# Patient Record
Sex: Female | Born: 1960 | Race: White | Hispanic: No | Marital: Married | Smoking: Never smoker
Health system: Southern US, Community
[De-identification: ages and names within clinical notes are randomized; demographics above are authoritative.]

## PROBLEM LIST (undated history)

## (undated) DIAGNOSIS — G43909 Migraine, unspecified, not intractable, without status migrainosus: Secondary | ICD-10-CM

## (undated) DIAGNOSIS — T8859XA Other complications of anesthesia, initial encounter: Secondary | ICD-10-CM

## (undated) DIAGNOSIS — R112 Nausea with vomiting, unspecified: Secondary | ICD-10-CM

## (undated) DIAGNOSIS — T4145XA Adverse effect of unspecified anesthetic, initial encounter: Secondary | ICD-10-CM

## (undated) DIAGNOSIS — Z9889 Other specified postprocedural states: Secondary | ICD-10-CM

## (undated) HISTORY — PX: FOOT SURGERY: SHX648

## (undated) HISTORY — PX: BILATERAL CARPAL TUNNEL RELEASE: SHX6508

## (undated) HISTORY — PX: ACCESSORY NAVICULAR EXCISION: SHX1121

## (undated) HISTORY — PX: JOINT REPLACEMENT: SHX530

## (undated) HISTORY — PX: HIP ARTHROPLASTY: SHX981

## (undated) HISTORY — PX: DILATION AND CURETTAGE OF UTERUS: SHX78

## (undated) HISTORY — PX: TUBAL LIGATION: SHX77

---
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
## 1993-02-18 DIAGNOSIS — G43909 Migraine, unspecified, not intractable, without status migrainosus: Secondary | ICD-10-CM | POA: Insufficient documentation

## 2005-10-01 ENCOUNTER — Ambulatory Visit: Payer: Self-pay | Admitting: Internal Medicine

## 2005-10-09 ENCOUNTER — Ambulatory Visit: Payer: Self-pay | Admitting: Internal Medicine

## 2005-10-18 ENCOUNTER — Ambulatory Visit: Payer: Self-pay | Admitting: Family Medicine

## 2005-11-29 ENCOUNTER — Ambulatory Visit: Payer: Self-pay | Admitting: Internal Medicine

## 2008-09-12 ENCOUNTER — Ambulatory Visit (HOSPITAL_COMMUNITY): Admission: RE | Admit: 2008-09-12 | Discharge: 2008-09-12 | Payer: Self-pay | Admitting: Orthopedic Surgery

## 2010-05-27 LAB — COMPREHENSIVE METABOLIC PANEL
ALT: 23 U/L (ref 0–35)
AST: 21 U/L (ref 0–37)
CO2: 24 mEq/L (ref 19–32)
Chloride: 106 mEq/L (ref 96–112)
Creatinine, Ser: 0.72 mg/dL (ref 0.4–1.2)
GFR calc Af Amer: >60 mL/min (ref >60)
GFR calc non Af Amer: >60 mL/min (ref >60)
Sodium: 139 mEq/L (ref 135–145)
Total Bilirubin: 0.8 mg/dL (ref 0.3–1.2)

## 2010-05-27 LAB — URINALYSIS, ROUTINE W REFLEX MICROSCOPIC
Hgb urine dipstick: NEGATIVE
Specific Gravity, Urine: 1.026 (ref 1.005–1.030)
Urobilinogen, UA: 0.2 mg/dL (ref 0.0–1.0)
pH: 5.5 (ref 5.0–8.0)

## 2010-05-27 LAB — CBC
Hemoglobin: 14.2 g/dL (ref 12.0–15.0)
MCV: 89.8 fL (ref 78.0–100.0)
RBC: 4.76 MIL/uL (ref 3.87–5.11)
WBC: 9.1 10*3/uL (ref 4.0–10.5)

## 2010-07-03 NOTE — Op Note (Signed)
[name], [name]               ACCOUNT NO.:  [id number]   MEDICAL RECORD NO.:  [id number]          PATIENT TYPE:  AMB   LOCATION:  DAY                          FACILITY:  [facility]   PHYSICIAN:  Ollen Gross, M.D.    DATE OF BIRTH:  12/20/1960   DATE OF PROCEDURE:  09/12/2008  DATE OF DISCHARGE:                               OPERATIVE REPORT   PREOPERATIVE DIAGNOSIS:  Right hip loose body labral tear.   POSTOPERATIVE DIAGNOSIS:  Right hip labral tear and chondral defect.   PROCEDURE:  Right hip arthroscopy with labral debridement and  chondroplasty.   SURGEON:  Ollen Gross, M.D.   ASSISTANT:  Avel Peace PA-C   ANESTHESIA:  General.   BLOOD LOSS:  Minimal.   DRAINS:  None.   COMPLICATIONS:  None.   CONDITION:  Stable to recovery.   CLINICAL NOTE:  [name] is a 50 year old female with a long history of  problems related to her right hip.  She recently developed significant  right hip pain and mechanical symptoms.  She has had a history of loose  bodies.  MRI showed questionable loose bodies.  Exam and history to me  suggested labral tear.  Given her significant pain and mechanical  symptoms, she presents now for arthroscopy of the hip.   PROCEDURE IN DETAIL:  After successful administration of general  anesthetic, the patient is placed in the left lateral decubitus position  with the right side up.  Right leg is draped over the well-padded  perineal post.  The left leg on the well-padded table.  Right foot is  placed into the foot holder and secured.  Traction is applied across the  joint under fluoroscopic guidance.  Once adequately distracted, it is  locked in this position.  Thigh was then prepped and draped in the usual  sterile fashion.  Spinal needles were then used to enter the joint  through the anterior and posterior peritrochanteric portals.  Once felt  to be in the joint and the saline was injected through the posterior  needle and comes out through the  anterior needle confirming both are  intra-articular.  The posterior portal was then created over a nitinol  wire, incision made and dilator placed and cannula and camera passed  into the joint.  Once confirmed to be intra-articular, inflow was then  initiated.  We first looked at the fovea and there is some hypertrophic  ligamentum teres as well as what appears to be some tethered bodies in  that area.  They are not free floaters but tethered to the tissue.  We  then created the anterior portal and passed a shaver in.  I debrided the  tissue back with the shaver and debrided out the tiny loose pieces.  We  then inspected posterior aspect of the joint which looked perfectly  normal.  I went all the way down to the posterior recess and did not see  any loose bodies there.  I then looked anteriorly and she does have a  labral tear.  I then went all the way from the 4 o'clock to  1 o'clock  position.  The labrum is not unstable or detached.  It is just  degenerative and torn.  I looked into the inferior recess and then  anteriorly and did not see any loose bodies.  The labrum was then  debrided back to a stable base with the shaver and sealed off with the  ArthroCare device.  There is also an associated chondral defect at about  the 2 o'clock position.  This debrided back with the shaver to a stable  bony base with stable cartilaginous edges.  This is a small defect only  about 1 x 1 cm.  The femoral head looks normal __________ .  The rest of  the joint is inspected and there are no other tears or loose bodies  noted.  The arthroscopic equipment is removed from the anterior portal  and 20 mL of 0.25% Marcaine with epinephrine injected through the inflow  cannula, then that is removed.  The traction is released from the joint.  The incisions were then closed with interrupted 4-0 nylon.  Bulky  sterile dressing applied and she is awakened and transported to recovery  in stable  condition.      Ollen Gross, M.D.  Electronically Signed     FA/MEDQ  D:  09/12/2008  T:  09/13/2008  Job:  [id number]

## 2010-08-28 ENCOUNTER — Other Ambulatory Visit: Payer: Self-pay | Admitting: Orthopedic Surgery

## 2010-08-28 ENCOUNTER — Encounter (HOSPITAL_COMMUNITY): Payer: BC Managed Care – PPO

## 2010-08-28 ENCOUNTER — Ambulatory Visit (HOSPITAL_COMMUNITY)
Admission: RE | Admit: 2010-08-28 | Discharge: 2010-08-28 | Disposition: A | Discharge status: Home / Self Care | Payer: BC Managed Care – PPO | Source: Ambulatory Visit | Attending: Orthopedic Surgery | Admitting: Orthopedic Surgery

## 2010-08-28 ENCOUNTER — Other Ambulatory Visit (HOSPITAL_COMMUNITY): Payer: Self-pay | Admitting: Orthopedic Surgery

## 2010-08-28 DIAGNOSIS — M169 Osteoarthritis of hip, unspecified: Secondary | ICD-10-CM

## 2010-08-28 DIAGNOSIS — Z01818 Encounter for other preprocedural examination: Secondary | ICD-10-CM | POA: Insufficient documentation

## 2010-08-28 DIAGNOSIS — M1610 Unilateral primary osteoarthritis, unspecified hip: Secondary | ICD-10-CM | POA: Insufficient documentation

## 2010-08-28 DIAGNOSIS — Z01812 Encounter for preprocedural laboratory examination: Secondary | ICD-10-CM | POA: Insufficient documentation

## 2010-08-28 LAB — COMPREHENSIVE METABOLIC PANEL
Alkaline Phosphatase: 133 U/L — ABNORMAL HIGH (ref 39–117)
BUN: 14 mg/dL (ref 6–23)
Calcium: 9.6 mg/dL (ref 8.4–10.5)
GFR calc Af Amer: >60 mL/min (ref >60)
Glucose, Bld: 91 mg/dL (ref 70–99)
Potassium: 3.7 mEq/L (ref 3.5–5.1)
Total Protein: 7.4 g/dL (ref 6.0–8.3)

## 2010-08-28 LAB — SURGICAL PCR SCREEN: MRSA, PCR: NEGATIVE

## 2010-08-28 LAB — CBC
HCT: 42.6 % (ref 36.0–46.0)
Hemoglobin: 14 g/dL (ref 12.0–15.0)
MCH: 28.9 pg (ref 26.0–34.0)
MCHC: 32.9 g/dL (ref 30.0–36.0)

## 2010-08-28 LAB — URINALYSIS, ROUTINE W REFLEX MICROSCOPIC
Bilirubin Urine: NEGATIVE
Glucose, UA: NEGATIVE mg/dL
Ketones, ur: NEGATIVE mg/dL
pH: 7.5 (ref 5.0–8.0)

## 2010-08-28 LAB — PROTIME-INR: Prothrombin Time: 12.2 seconds (ref 11.6–15.2)

## 2010-09-05 ENCOUNTER — Inpatient Hospital Stay (HOSPITAL_COMMUNITY): Payer: BC Managed Care – PPO

## 2010-09-05 ENCOUNTER — Inpatient Hospital Stay (HOSPITAL_COMMUNITY)
Admission: RE | Admit: 2010-09-05 | Discharge: 2010-09-08 | DRG: 818 | Disposition: A | Discharge status: Home Health | Payer: BC Managed Care – PPO | Source: Ambulatory Visit | Attending: Orthopedic Surgery | Admitting: Orthopedic Surgery

## 2010-09-05 DIAGNOSIS — E7800 Pure hypercholesterolemia, unspecified: Secondary | ICD-10-CM | POA: Diagnosis present

## 2010-09-05 DIAGNOSIS — M169 Osteoarthritis of hip, unspecified: Principal | ICD-10-CM | POA: Diagnosis present

## 2010-09-05 DIAGNOSIS — Z01812 Encounter for preprocedural laboratory examination: Secondary | ICD-10-CM

## 2010-09-05 DIAGNOSIS — M1610 Unilateral primary osteoarthritis, unspecified hip: Principal | ICD-10-CM | POA: Diagnosis present

## 2010-09-05 LAB — TYPE AND SCREEN

## 2010-09-06 LAB — CBC
Hemoglobin: 10.6 g/dL — ABNORMAL LOW (ref 12.0–15.0)
MCH: 28.6 pg (ref 26.0–34.0)
RBC: 3.71 MIL/uL — ABNORMAL LOW (ref 3.87–5.11)
WBC: 10.3 10*3/uL (ref 4.0–10.5)

## 2010-09-06 LAB — BASIC METABOLIC PANEL
CO2: 28 mEq/L (ref 19–32)
Calcium: 9 mg/dL (ref 8.4–10.5)
Chloride: 102 mEq/L (ref 96–112)
Glucose, Bld: 130 mg/dL — ABNORMAL HIGH (ref 70–99)
Potassium: 4 mEq/L (ref 3.5–5.1)
Sodium: 137 mEq/L (ref 135–145)

## 2010-09-07 LAB — CBC
HCT: 34.9 % — ABNORMAL LOW (ref 36.0–46.0)
Hemoglobin: 11.5 g/dL — ABNORMAL LOW (ref 12.0–15.0)
MCH: 29.3 pg (ref 26.0–34.0)
MCV: 89 fL (ref 78.0–100.0)
RBC: 3.92 MIL/uL (ref 3.87–5.11)

## 2010-09-07 LAB — BASIC METABOLIC PANEL
BUN: 10 mg/dL (ref 6–23)
CO2: 30 mEq/L (ref 19–32)
Calcium: 9.4 mg/dL (ref 8.4–10.5)
Chloride: 99 mEq/L (ref 96–112)
Creatinine, Ser: 0.49 mg/dL — ABNORMAL LOW (ref 0.50–1.10)
Glucose, Bld: 102 mg/dL — ABNORMAL HIGH (ref 70–99)

## 2010-09-07 NOTE — H&P (Signed)
  [name], WALDRIP NO.:  [id number]  MEDICAL RECORD NO.:  [id number]  LOCATION:                                 FACILITY:  PHYSICIAN:  Ollen Gross, M.D.    DATE OF BIRTH:  03-13-1960  DATE OF ADMISSION:  09/05/2010 DATE OF DISCHARGE:                             HISTORY & PHYSICAL   CHIEF COMPLAINT:  Right hip pain.  HISTORY OF PRESENT ILLNESS:  The patient is a 50 year old female who has been seen by Dr. Lequita Halt for ongoing right hip pain.  She has had hip scoped in the past and has had tears.  Unfortunately, hip has gone on to develop end-stage arthritis.  It is felt at this point she would not benefit from undergoing another arthroscopy, could only benefit from undergoing a hip replacement.  Risks and benefits have been discussed. She elected to proceed with surgery.  ALLERGIES:  No known drug allergies.  CURRENT MEDICATIONS:  Meloxicam, Vicodin, sumatriptan.  PAST MEDICAL HISTORY: 1. Occasional migraines. 2. Hypercholesterolemia. 3. Postmenopausal. 4. Childhood illnesses of mumps and arthritis.  PAST SURGICAL HISTORY:  Right hip arthroscopy, removal of loose bodies; another hip arthroscopy in the 1990s; and another right hip arthroscopy. Please note the patient states she always gets sick after having a general anesthesia.  FAMILY HISTORY:  Father deceased at age 35 of colon cancer. Mother age 1, no medical issues.  She has 2 brothers, both healthy.  Two sisters.  SOCIAL HISTORY:  Married, Chief of Staff.  Nonsmoker.  Two to four glasses of wine per week.  Husband will be assisting with care after surgery.  She has 3 steps entering her home.  She has a split level 7 steps going upstairs and 9 steps going downstairs.  REVIEW OF SYSTEMS:  GENERAL:  No fever, chills, or night sweats. NEUROLOGIC:  No seizures, syncope, or paralysis.  RESPIRATORY:  No shortness of breath, productive cough, or hemoptysis.  CARDIOVASCULAR: No chest pain,  orthopnea.  GI:  Little bit of mild stress urinary incontinence.  No dysuria, hematuria.  MUSCULOSKELETAL:  Hip pain.  PHYSICAL EXAMINATION:  VITAL SIGNS:  Pulse 96, respirations 12, blood pressure 138/62. GENERAL:  A 50 year old white female well nourished, well developed, overweight, no acute distress.  She is anxious, alert, oriented, cooperative, pleasant.  Excellent historian. HEENT:  Normocephalic, atraumatic.  Pupils are round and reactive.  EOMs intact. NECK:  Supple. CHEST:  Clear. HEART:  Regular rate and rhythm without murmur. ABDOMEN:  Round, soft, slightly protuberant abdomen, bowel sounds present. RECTAL/BREAST/GENITALIA:  Not done, not pertinent to present illness. EXTREMITIES:  Right hip flexion 110, internal rotation 20, external rotation 30, abduction 40.  IMPRESSION:  Osteoarthritis, right hip.  PLAN:  The patient will be admitted to [facility] to undergo right total hip replacement arthroplasty.  Surgery will be performed by Dr. Ollen Gross.     Alexzandrew L. Julien Girt, P.A.C.______________________________ Ollen Gross, M.D.    ALP/MEDQ  D:  09/02/2010  T:  09/03/2010  Job:  [id number]  Electronically Signed by Patrica Duel P.A.C. on 09/05/2010 10:44:49 AM Electronically Signed by Ollen Gross M.D. on 09/07/2010 06:06:07 PM

## 2010-09-07 NOTE — Op Note (Signed)
[name], [name] NO.:  [id number]  MEDICAL RECORD NO.:  [id number]  LOCATION:  0005                         FACILITY:  [facility] [address]  PHYSICIAN:  Ollen Gross, M.D.    DATE OF BIRTH:  28-May-1960  DATE OF PROCEDURE:  09/05/2010 DATE OF DISCHARGE:                              OPERATIVE REPORT   PREOPERATIVE DIAGNOSIS:  Osteoarthritis, right hip.  POSTOPERATIVE DIAGNOSIS:  Osteoarthritis, right hip.  PROCEDURE:  Right total hip arthroplasty.  SURGEON:  Ollen Gross, M.D.  ASSISTANT:  Alexzandrew L. Perkins, P.A.C.  ANESTHESIA:  General.  ESTIMATED BLOOD LOSS:  100.Marland Kitchen  DRAIN:  Hemovac x1.  COMPLICATIONS:  None.  CONDITION:  Stable to recovery.  BRIEF CLINICAL NOTE:  [name] is a 50 year old female who has advanced arthritis secondary to dysplasia, right hip.  She has bone-on-bone with severe pain and dysfunction.  She has had previous hip arthroscopy. Arthritis has progressed and she presents for total hip arthroplasty.  PROCEDURE IN DETAIL:  After successful administration of general anesthetic, the patient was placed in left lateral decubitus position with the right side up and held with the hip positioner.  Right lower extremity was isolated from her perineum with plastic drapes and prepped and draped in the usual sterile fashion.  Short posterolateral incision was made with a 10 blade through subcutaneous tissue to the level of fascia lata which was incised in line with the skin incision.  The sciatic nerve was palpated and protected and short rotators and capsule isolated off the femur.  Hip was dislocated.  The femoral head was quite dysplastic.  Center of femoral head was identified and marked.  A trial prosthesis placed such that the center of the trial head corresponds to the center of native femoral head.  Osteotomy line was marked on the femoral neck and osteotomy made with an oscillating saw.  Femoral head was removed.  Femoral retractors  were placed to gain access to the proximal femur. The starter reamer was passed into the femoral canal and the canal thoroughly irrigated with saline to remove fatty contents.  Axial reaming was performed to 11.5 mm, proximal reaming to 68F and the sleeve machined to a large.  A 68F large trial sleeve was then placed.  The femur was then retracted anteriorly to gain acetabular exposure. Acetabular retractors were placed in labrum and osteophytes were removed.  Acetabular reaming was performed up to 53 mm with placement of 54 mm pinnacle acetabular shell.  It was placed in anatomic position, transfixed with dome screw with excellent purchase.  Apex hole eliminator was placed and a 36-mm neutral +4 marathon liner placed.  We then placed a trial stem which was 16 x 11 with 36 +6 neck matching native anteversion.  The 36 +0 trial head was placed.  This reduced easily, so went to +2 which had more appropriate soft tissue tension. There was great stability with full extension, full external rotation, 70 degrees flexion, 40 degrees adduction, 90 degrees internal rotation, 90 degrees of flexion and 70 degrees of internal rotation.  By placing the right leg on top of the left, leg lengths were now equal.  Hip was dislocated, trial was removed.  Permanent 20F large sleeve was placed with a 16 x 11 stem and 36 +6 neck matching native anteversion.  The 36 +3 ceramic head was placed and the hip was reduced with the same stability parameters.  The wound was copiously irrigated with saline solution and the short rotators and capsule then reattached to the femur through drill holes with Ethibond suture.  Fascia lata was closed over Hemovac drain with interrupted #1 Vicryl, subcu closed #1 and #2-0 Vicryl subcu and subcuticular with running 4-0 Monocryl.  Catheter for Marcaine pain pump was placed and pump initiated.  Incision cleaned and dried and Steri-Strips and bulky sterile dressing applied.  She was  then placed in a knee immobilizer, awakened and transferred to recovery in stable condition.     Ollen Gross, M.D.     FA/MEDQ  D:  09/05/2010  T:  09/05/2010  Job:  [id number]  Electronically Signed by Ollen Gross M.D. on 09/07/2010 06:06:09 PM

## 2010-09-08 LAB — CBC
HCT: 32.1 % — ABNORMAL LOW (ref 36.0–46.0)
MCH: 28.8 pg (ref 26.0–34.0)
MCV: 88.9 fL (ref 78.0–100.0)
Platelets: 231 10*3/uL (ref 150–400)
RDW: 13.4 % (ref 11.5–15.5)

## 2010-10-01 NOTE — Discharge Summary (Signed)
  NAMETAKIMA, [name] NO.:  [id number]  MEDICAL RECORD NO.:  [id number]  LOCATION:  1607                         FACILITY:  [facility]  PHYSICIAN:  Ollen Gross, M.D.    DATE OF BIRTH:  05-Aug-1960  DATE OF ADMISSION:  09/05/2010 DATE OF DISCHARGE:  09/08/2010                              DISCHARGE SUMMARY   ADMITTING DIAGNOSES: 1. Osteoarthritis, right hip. 2. Occasional migraines. 3. Hypercholesterolemia. 4. Postmenopausal. 5. History childhood illnesses and mumps. 6. Osteoarthritis.  DISCHARGE DIAGNOSES: 1. Osteoarthritis of right hip, status post right total hip     replacement arthroplasty. 2. Osteoarthritis, right hip. 3. Occasional migraines. 4. Hypercholesterolemia. 5. Postmenopausal. 6. History childhood illnesses and mumps. 7. Osteoarthritis.  PROCEDURE:  September 05, 2010, right total hip.  SURGEON:  Ollen Gross, M.D.  ASSISTANT:  Alexzandrew L. Perkins, P.A.C.  ANESTHESIA:  General.  CONSULTS:  None.  BRIEF HISTORY:  The patient is a 50 year old female with advanced arthritis secondary to hip dysplasia, bone-on-bone, with severe pain and dysfunction, now presents for total hip arthroplasty.  LABORATORY DATA:  Admission CBC not scanned in the chart with a preop hemoglobin of 14.  Hemoglobin dropped down to 10.6, came back up to 11.5.  Last H and H was 10.4 and 32.1.  Chem panel on admission not scanned in the chart, but the BMETs were followed for 48 hours; all remained within normal limits.  Blood group type:  A+.  X-rays of right hip and pelvis film showed satisfactory right total hip arthroplasty.  HOSPITAL COURSE:  The patient admitted to [facility], taken to OR, underwent above-stated procedure without complication.  The patient tolerated the procedure well, later transferred to the orthopedic floor, started on p.o. and IV analgesic pain control following surgery.  Given 24-hours postop IV antibiotics.  Doing pretty  well on the morning of day #1.  The patient wanted to go home, so we arranged home health care. Hemovac drain was pulled on day #1, starting get up out of bed.  Allowed to be partial weightbearing by day #2.  The patient is doing well, working with therapy, continued to progress.  By day #3, the patient met goals, tolerating meds, and was discharged to home.  DISCHARGE PLAN: 1. The patient was discharged home on September 08, 2010. 2. Discharge diagnoses, please see above. 3. Discharge medications:  OxyIR, Robaxin, Xarelto.  Continue     sumatriptan nasal spray as needed.  DIET:  As tolerated.  FOLLOWUP:  2 weeks.  DISPOSITION:  Home.  CONDITION ON DISCHARGE:  Improving.     Alexzandrew L. Julien Girt, P.A.C.   ______________________________ Ollen Gross, M.D.    ALP/MEDQ  D:  09/20/2010  T:  09/21/2010  Job:  [id number]  Electronically Signed by Patrica Duel P.A.C. on 09/21/2010 09:41:16 AM Electronically Signed by Ollen Gross M.D. on 10/01/2010 11:35:56 AM

## 2012-04-23 IMAGING — CR DG PORTABLE PELVIS
1 series · 1 of 1 positions shown · non-contrast
Comparison: None

CLINICAL DATA: Status post right total hip arthroplasty

PORTABLE PELVIS

[AP]
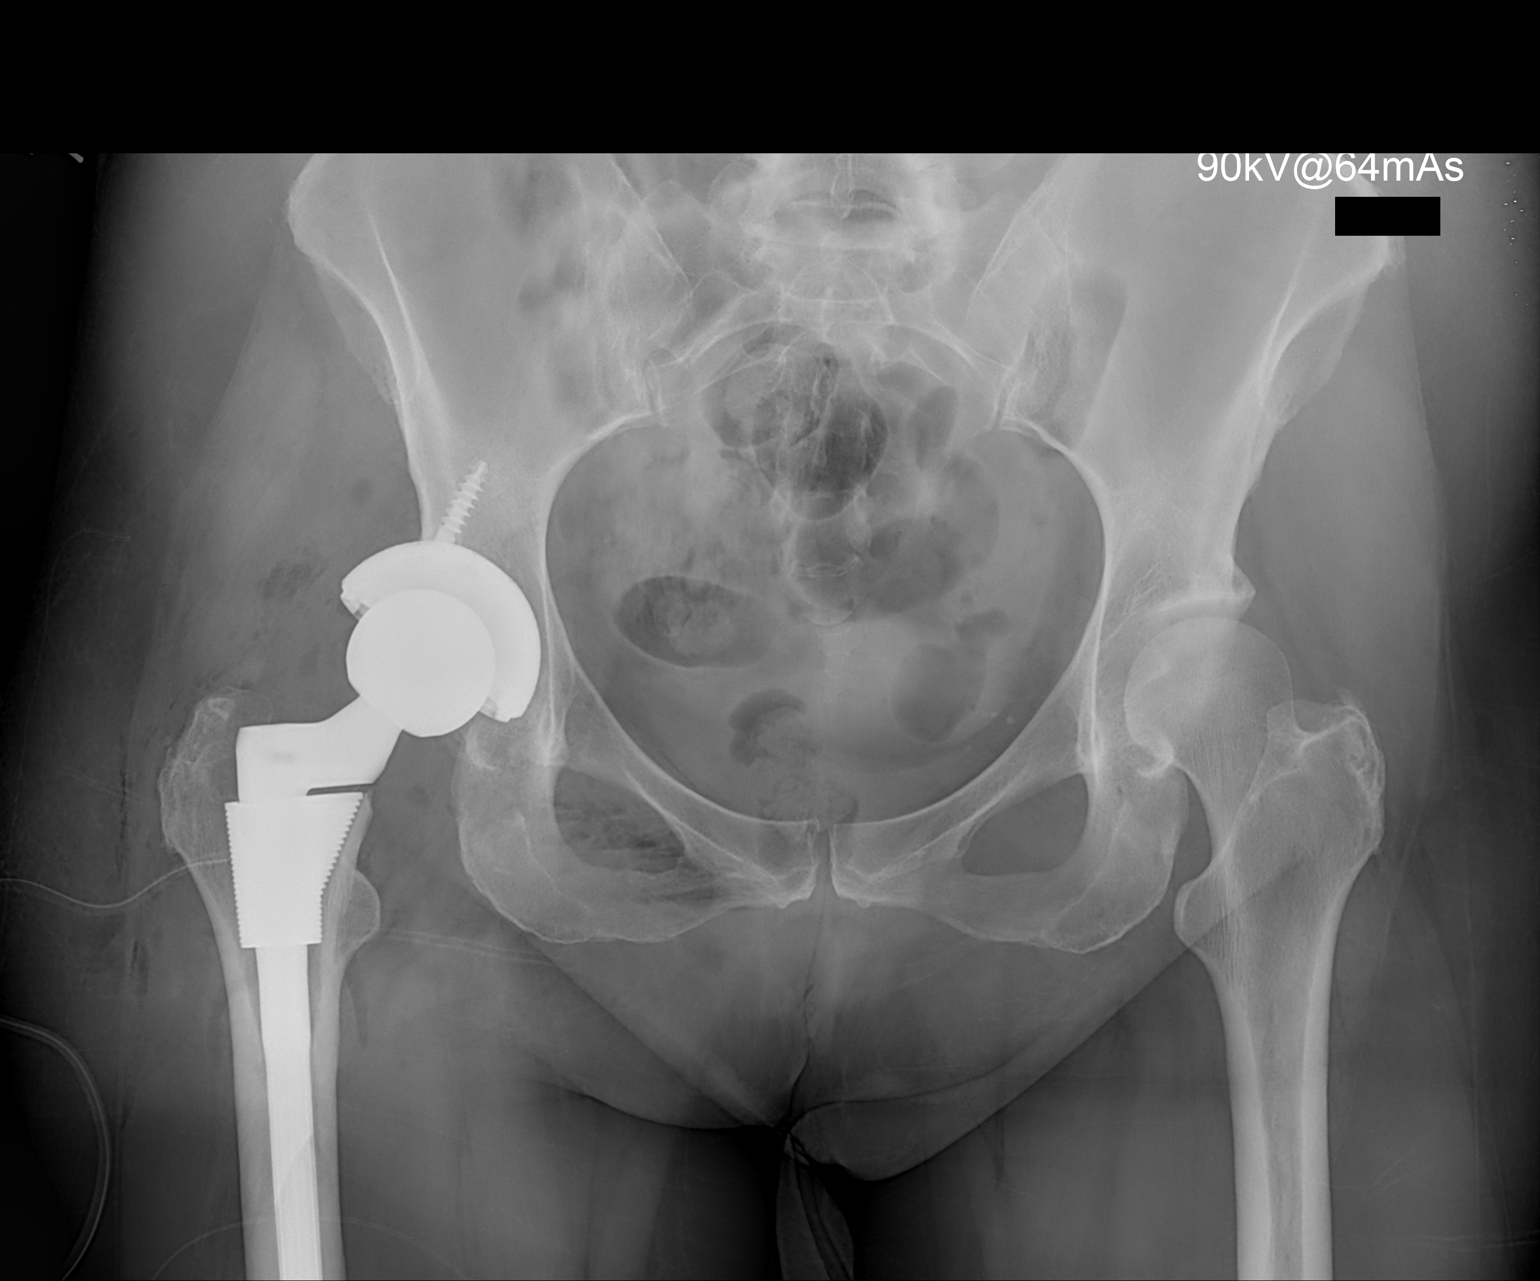

[1 of 1 positions shown; findings below may reference images not displayed]

FINDINGS: Postoperative changes from a right total hip arthroplasty
are identified.

The lateral margin of the acetabular cup is approximately 2.4 cm
lateral to the bony margins of the iliac bone.

There is no evidence for arthroplasty dislocation or periprosthetic
fracture.  Surgical drainage catheter overlies the proximal femur.
IMPRESSION: 1.  Status post right hip arthroplasty.
2.  The lateral margin of the acetabular cup appears to be
approximately 2.4 cm lateral to the bony margins of the iliac bone.

## 2013-03-15 ENCOUNTER — Other Ambulatory Visit (HOSPITAL_COMMUNITY)
Admission: RE | Admit: 2013-03-15 | Discharge: 2013-03-15 | Disposition: A | Discharge status: Home / Self Care | Payer: BC Managed Care – PPO | Source: Ambulatory Visit | Attending: Family Medicine | Admitting: Family Medicine

## 2013-03-15 ENCOUNTER — Other Ambulatory Visit: Payer: Self-pay | Admitting: Family Medicine

## 2013-03-15 DIAGNOSIS — Z124 Encounter for screening for malignant neoplasm of cervix: Secondary | ICD-10-CM | POA: Insufficient documentation

## 2013-04-07 ENCOUNTER — Encounter: Payer: Self-pay | Admitting: Internal Medicine

## 2015-11-22 LAB — HM MAMMOGRAPHY

## 2015-11-30 ENCOUNTER — Encounter: Payer: Self-pay | Admitting: Internal Medicine

## 2016-01-16 ENCOUNTER — Ambulatory Visit: Payer: BC Managed Care – PPO | Admitting: Internal Medicine

## 2016-02-14 ENCOUNTER — Encounter: Payer: Self-pay | Admitting: Internal Medicine

## 2016-02-14 ENCOUNTER — Ambulatory Visit (INDEPENDENT_AMBULATORY_CARE_PROVIDER_SITE_OTHER): Payer: 59 | Admitting: Internal Medicine

## 2016-02-14 VITALS — BP 114/80 | HR 102 | Temp 98.2°F | Resp 16 | Ht 66.0 in | Wt 195.0 lb

## 2016-02-14 DIAGNOSIS — N393 Stress incontinence (female) (male): Secondary | ICD-10-CM | POA: Insufficient documentation

## 2016-02-14 DIAGNOSIS — Z0000 Encounter for general adult medical examination without abnormal findings: Secondary | ICD-10-CM

## 2016-02-14 DIAGNOSIS — G43109 Migraine with aura, not intractable, without status migrainosus: Secondary | ICD-10-CM | POA: Diagnosis not present

## 2016-02-14 DIAGNOSIS — R590 Localized enlarged lymph nodes: Secondary | ICD-10-CM | POA: Diagnosis not present

## 2016-02-14 MED ORDER — ONDANSETRON HCL 4 MG PO TABS: 4.0000 mg | ORAL_TABLET | Freq: Three times a day (TID) | ORAL | 0 refills | Status: DC | PRN

## 2016-02-14 MED ORDER — RIZATRIPTAN BENZOATE 10 MG PO TBDP: 10.0000 mg | ORAL_TABLET | ORAL | 5 refills | Status: DC | PRN

## 2016-02-14 MED ORDER — SUMATRIPTAN 20 MG/ACT NA SOLN: 20.0000 mg | NASAL | 5 refills | Status: DC | PRN

## 2016-02-14 NOTE — Assessment & Plan Note (Addendum)
Mild Uses a pad Decrease caffeine Work on weight loss Kegal exercise Discuss with gyn Discuss other options of pelvic PT, medications

## 2016-02-14 NOTE — Progress Notes (Signed)
Pre visit review using our clinic review tool, if applicable. No additional management support is needed unless otherwise documented below in the visit note. 

## 2016-02-14 NOTE — Progress Notes (Signed)
Subjective:    Patient ID: [name], female    DOB: 1960/03/14, 55 y.o.   MRN: [id number]  HPI She is here to establish with a new pcp.  She is here for a physical exam.    Migraines:  Her migraines have improved over the past several years.  She now gets about 4 migraines a year.  They usually wake her up at 4 am.  She takes the imitrex nasal spray and goes back to sleep.  The nasal spray works good, but the medicine can go down her throat and cause her to gag.  She has to lay a certain way to prevent that.  She knows there is a under the tongue medication and she wondered if that would help.  She takes zofran when she has a migraine as well.    Stress incontinence:  It is mild.  She is post menopausal.  She tends to drink a lot of coffee during the day.  She has been off for the past couple of days and has not drank as much coffee and has seen an improvement.   She just had foot/ankle surgery two days ago.  Her right foot is in a cast.   Medications and allergies reviewed with patient and updated if appropriate.  Patient Active Problem List   Diagnosis Date Noted  . Stress incontinence 02/14/2016  . Cervical lymphadenopathy 02/14/2016  . Migraines 02/18/1993    No current outpatient prescriptions on file prior to visit.   No current facility-administered medications on file prior to visit.     History reviewed. No pertinent past medical history.  Past Surgical History:  Procedure Laterality Date  . ACCESSORY NAVICULAR EXCISION     right  . JOINT REPLACEMENT     Right hip replacement    Social History   Social History  . Marital status: Married    Spouse name: N/A  . Number of children: N/A  . Years of education: N/A   Social History Main Topics  . Smoking status: Never Smoker  . Smokeless tobacco: Never Used  . Alcohol use Yes     Comment: 1-2 glasses/week  . Drug use: No  . Sexual activity: Not Asked   Other Topics Concern  . None   Social  History Narrative  . None    Family History  Problem Relation Age of Onset  . Hypertension Mother   . Cancer Father 4    Colon    Review of Systems  Constitutional: Negative for chills and fever.  Eyes: Negative for visual disturbance.  Respiratory: Negative for cough, shortness of breath and wheezing.   Cardiovascular: Negative for chest pain, palpitations and leg swelling.  Gastrointestinal: Negative for abdominal pain, blood in stool, constipation, diarrhea and nausea.       No gerd  Genitourinary: Negative for dysuria and hematuria.  Musculoskeletal: Negative for arthralgias and back pain.  Skin: Negative for color change and rash.  Neurological: Positive for headaches (migraines). Negative for dizziness and light-headedness.  Psychiatric/Behavioral: Negative for dysphoric mood. The patient is nervous/anxious.        Objective:   Vitals:   02/14/16 0941  BP: 114/80  Pulse: (!) 102  Resp: 16  Temp: 98.2 F (36.8 C)   Filed Weights   02/14/16 0941  Weight: 195 lb (88.5 kg)   Body mass index is 31.47 kg/m.   Physical Exam  Constitutional: She is oriented to person, place, and time. She appears well-developed  and well-nourished. No distress.  HENT:  Head: Normocephalic and atraumatic.  Right Ear: External ear normal.  Left Ear: External ear normal.  Mouth/Throat: Oropharynx is clear and moist.  B/l ear canals and TM normal  Eyes: Conjunctivae are normal.  Neck: Neck supple. No tracheal deviation present. No thyromegaly present.  Cardiovascular: Normal rate, regular rhythm and normal heart sounds.   No murmur heard. Pulmonary/Chest: Breath sounds normal. No respiratory distress. She has no wheezes. She has no rales.  Abdominal: Soft. She exhibits no distension. There is no tenderness. There is no rebound and no guarding.  Musculoskeletal: She exhibits edema (right toes).  Right foot and lower leg in cast  Lymphadenopathy:    She has cervical adenopathy  (right anterior upper LN palpable, non tender; minimally palpable left anterior and posterior LN b/l, no occipital, auricular LN).  Neurological: She is alert and oriented to person, place, and time.  Skin: Skin is warm and dry. She is not diaphoretic.  Psychiatric: She has a normal mood and affect. Her behavior is normal. Thought content normal.          Assessment & Plan:   Physical exam: Screening blood work  ordered Immunizations  Up to date  Colonoscopy last done 2015 - due 2020 ( family history) Mammogram  Up to date  Gyn  Up to date  Exercise - not able to exercise now - stressed importance of regular exercise Weight - advised weight loss Skin  - no concerns Substance abuse  none     See Problem List for Assessment and Plan of chronic medical problems.    See Problem List for Assessment and Plan of chronic medical problems.  F/u annually

## 2016-02-14 NOTE — Patient Instructions (Addendum)
  Test(s) ordered today. Your results will be released to [place] (or called to you) after review, usually within 72hours after test completion. If any changes need to be made, you will be notified at that same time.  All other Health Maintenance issues reviewed.   All recommended immunizations and age-appropriate screenings are up-to-date or discussed.  No immunizations administered today.   Medications reviewed and updated.  Changes include trying the maxalt for your headaches.  Your prescription(s) have been submitted to your pharmacy. Please take as directed and contact our office if you believe you are having problem(s) with the medication(s).  A Ultrasound of your neck was ordered.   Please followup in one year for a physical

## 2016-02-14 NOTE — Assessment & Plan Note (Signed)
Will try maxalt ODT if covered by insurance Can use either the nasal spray or maxalt depending on the circumstances zofran as needed

## 2016-02-14 NOTE — Assessment & Plan Note (Signed)
Palpable cervical lymph nodes - prominent LN in right anterior chain, mild LN in left anterior, b/l posterior cervical Likely reactive Will get an US of the soft tissues of her neck  cbc, tsh

## 2016-02-16 ENCOUNTER — Ambulatory Visit
Admission: RE | Admit: 2016-02-16 | Discharge: 2016-02-16 | Disposition: A | Discharge status: Home / Self Care | Payer: 59 | Source: Ambulatory Visit | Attending: Internal Medicine | Admitting: Internal Medicine

## 2016-02-16 ENCOUNTER — Other Ambulatory Visit (INDEPENDENT_AMBULATORY_CARE_PROVIDER_SITE_OTHER): Payer: 59

## 2016-02-16 DIAGNOSIS — Z0000 Encounter for general adult medical examination without abnormal findings: Secondary | ICD-10-CM

## 2016-02-16 DIAGNOSIS — R7989 Other specified abnormal findings of blood chemistry: Secondary | ICD-10-CM

## 2016-02-16 DIAGNOSIS — R590 Localized enlarged lymph nodes: Secondary | ICD-10-CM

## 2016-02-16 LAB — CBC WITH DIFFERENTIAL/PLATELET
BASOS PCT: 0.4 % (ref 0.0–3.0)
Basophils Absolute: 0 10*3/uL (ref 0.0–0.1)
EOS ABS: 0.1 10*3/uL (ref 0.0–0.7)
Eosinophils Relative: 1.3 % (ref 0.0–5.0)
HEMATOCRIT: 41.2 % (ref 36.0–46.0)
Hemoglobin: 14.2 g/dL (ref 12.0–15.0)
LYMPHS ABS: 2.3 10*3/uL (ref 0.7–4.0)
Lymphocytes Relative: 26.8 % (ref 12.0–46.0)
MCHC: 34.4 g/dL (ref 30.0–36.0)
MCV: 86.8 fl (ref 78.0–100.0)
MONO ABS: 0.5 10*3/uL (ref 0.1–1.0)
Monocytes Relative: 5.7 % (ref 3.0–12.0)
NEUTROS ABS: 5.6 10*3/uL (ref 1.4–7.7)
Neutrophils Relative %: 65.8 % (ref 43.0–77.0)
PLATELETS: 320 10*3/uL (ref 150.0–400.0)
RBC: 4.75 Mil/uL (ref 3.87–5.11)
RDW: 13.3 % (ref 11.5–15.5)
WBC: 8.6 10*3/uL (ref 4.0–10.5)

## 2016-02-16 LAB — LIPID PANEL
Cholesterol: 264 mg/dL — ABNORMAL HIGH (ref 0–200)
HDL: 47.6 mg/dL
NonHDL: 216.8
Total CHOL/HDL Ratio: 6
Triglycerides: 218 mg/dL — ABNORMAL HIGH (ref 0.0–149.0)
VLDL: 43.6 mg/dL — ABNORMAL HIGH (ref 0.0–40.0)

## 2016-02-16 LAB — LDL CHOLESTEROL, DIRECT: Direct LDL: 186 mg/dL

## 2016-02-16 LAB — COMPREHENSIVE METABOLIC PANEL
ALT: 32 U/L (ref 0–35)
AST: 19 U/L (ref 0–37)
Albumin: 4.2 g/dL (ref 3.5–5.2)
Alkaline Phosphatase: 115 U/L (ref 39–117)
BILIRUBIN TOTAL: 0.6 mg/dL (ref 0.2–1.2)
BUN: 19 mg/dL (ref 6–23)
CHLORIDE: 104 meq/L (ref 96–112)
CO2: 28 meq/L (ref 19–32)
CREATININE: 0.69 mg/dL (ref 0.40–1.20)
Calcium: 9.5 mg/dL (ref 8.4–10.5)
GFR: 94.21 mL/min (ref >60.00)
GLUCOSE: 110 mg/dL — AB (ref 70–99)
Potassium: 4.4 mEq/L (ref 3.5–5.1)
SODIUM: 140 meq/L (ref 135–145)
Total Protein: 7 g/dL (ref 6.0–8.3)

## 2016-02-16 LAB — TSH: TSH: 1.52 u[IU]/mL (ref 0.35–4.50)

## 2016-02-18 ENCOUNTER — Encounter: Payer: Self-pay | Admitting: Internal Medicine

## 2016-02-18 DIAGNOSIS — E785 Hyperlipidemia, unspecified: Secondary | ICD-10-CM | POA: Insufficient documentation

## 2016-02-18 DIAGNOSIS — R739 Hyperglycemia, unspecified: Secondary | ICD-10-CM

## 2016-02-18 DIAGNOSIS — R7303 Prediabetes: Secondary | ICD-10-CM | POA: Insufficient documentation

## 2016-02-23 DIAGNOSIS — M6701 Short Achilles tendon (acquired), right ankle: Secondary | ICD-10-CM | POA: Diagnosis not present

## 2016-02-24 ENCOUNTER — Encounter: Payer: Self-pay | Admitting: Internal Medicine

## 2016-02-24 DIAGNOSIS — Z800 Family history of malignant neoplasm of digestive organs: Secondary | ICD-10-CM | POA: Insufficient documentation

## 2016-03-26 ENCOUNTER — Ambulatory Visit (INDEPENDENT_AMBULATORY_CARE_PROVIDER_SITE_OTHER): Payer: 59 | Admitting: Internal Medicine

## 2016-03-26 ENCOUNTER — Encounter: Payer: Self-pay | Admitting: Internal Medicine

## 2016-03-26 DIAGNOSIS — J309 Allergic rhinitis, unspecified: Secondary | ICD-10-CM | POA: Insufficient documentation

## 2016-03-26 DIAGNOSIS — J3089 Other allergic rhinitis: Secondary | ICD-10-CM

## 2016-03-26 NOTE — Progress Notes (Signed)
   Subjective:    Patient ID: [name], female    DOB: Jun 23, 1960, 56 y.o.   MRN: [id number]  HPI The patient is a 56 YO female coming in for cold symptoms. Going on for 3 days now. Son tested positive for flu so her husband sent her in to get tested. She denies fevers, chills, muscle aches, headaches. She is having mild congestion and drainage which dayquil and nyquil is helping well. She denies SOB or cough. No sore throat. Overall symptoms are stable or mildly improving.   Review of Systems  Constitutional: Negative for activity change, appetite change, chills, fatigue, fever and unexpected weight change.  HENT: Positive for congestion and rhinorrhea. Negative for ear discharge, ear pain, nosebleeds, postnasal drip, sinus pain, sinus pressure, sore throat and trouble swallowing.   Eyes: Negative.   Respiratory: Negative.   Cardiovascular: Negative.   Gastrointestinal: Negative.   Musculoskeletal: Negative.   Skin: Negative.       Objective:   Physical Exam  Constitutional: She is oriented to person, place, and time. She appears well-developed and well-nourished.  HENT:  Head: Normocephalic and atraumatic.  Right Ear: External ear normal.  Left Ear: External ear normal.  Oropharynx with mild redness and clear drainage.   Eyes: EOM are normal.  Neck: Normal range of motion. No JVD present.  Cardiovascular: Normal rate and regular rhythm.   Pulmonary/Chest: Effort normal and breath sounds normal.  Abdominal: Soft.  Lymphadenopathy:    She has no cervical adenopathy.  Neurological: She is alert and oriented to person, place, and time.  Skin: Skin is warm and dry.   Vitals:   03/26/16 1443  BP: 120/70  Pulse: 99  Temp: 98.8 F (37.1 C)  TempSrc: Oral  SpO2: 99%  Weight: 195 lb (88.5 kg)  Height: 5\' 6"  (1.676 m)      Assessment & Plan:

## 2016-03-26 NOTE — Assessment & Plan Note (Addendum)
She is taking otc cold medication which is sufficient. Explained she likely does not have the flu and does not need testing as she is outside the window for tamiflu. She will come back if no improvement, increase in cough or SOB. No indication for antibiotics or steroids today.

## 2016-03-26 NOTE — Patient Instructions (Signed)
We do not need to do a flu test today.   If you do get a fever you need to stay home until fever free for 24 hours.   Make sure to keep up with hand washing and covering your mouth.

## 2016-03-26 NOTE — Progress Notes (Signed)
Pre visit review using our clinic review tool, if applicable. No additional management support is needed unless otherwise documented below in the visit note. 

## 2016-04-04 DIAGNOSIS — M79671 Pain in right foot: Secondary | ICD-10-CM | POA: Diagnosis not present

## 2016-04-11 DIAGNOSIS — M79671 Pain in right foot: Secondary | ICD-10-CM | POA: Diagnosis not present

## 2016-04-18 DIAGNOSIS — M79671 Pain in right foot: Secondary | ICD-10-CM | POA: Diagnosis not present

## 2016-04-25 DIAGNOSIS — M79671 Pain in right foot: Secondary | ICD-10-CM | POA: Diagnosis not present

## 2016-04-26 DIAGNOSIS — Q742 Other congenital malformations of lower limb(s), including pelvic girdle: Secondary | ICD-10-CM | POA: Diagnosis not present

## 2016-07-19 DIAGNOSIS — D1723 Benign lipomatous neoplasm of skin and subcutaneous tissue of right leg: Secondary | ICD-10-CM | POA: Diagnosis not present

## 2016-07-19 DIAGNOSIS — D224 Melanocytic nevi of scalp and neck: Secondary | ICD-10-CM | POA: Diagnosis not present

## 2016-07-19 DIAGNOSIS — D485 Neoplasm of uncertain behavior of skin: Secondary | ICD-10-CM | POA: Diagnosis not present

## 2016-09-05 DIAGNOSIS — G5601 Carpal tunnel syndrome, right upper limb: Secondary | ICD-10-CM | POA: Diagnosis not present

## 2016-09-05 DIAGNOSIS — M79641 Pain in right hand: Secondary | ICD-10-CM | POA: Diagnosis not present

## 2016-09-20 DIAGNOSIS — G5601 Carpal tunnel syndrome, right upper limb: Secondary | ICD-10-CM | POA: Diagnosis not present

## 2016-09-20 DIAGNOSIS — M79641 Pain in right hand: Secondary | ICD-10-CM | POA: Diagnosis not present

## 2016-09-20 DIAGNOSIS — G5602 Carpal tunnel syndrome, left upper limb: Secondary | ICD-10-CM | POA: Diagnosis not present

## 2016-09-20 DIAGNOSIS — M1611 Unilateral primary osteoarthritis, right hip: Secondary | ICD-10-CM | POA: Diagnosis not present

## 2016-10-07 DIAGNOSIS — G5602 Carpal tunnel syndrome, left upper limb: Secondary | ICD-10-CM | POA: Diagnosis not present

## 2016-10-07 DIAGNOSIS — G5601 Carpal tunnel syndrome, right upper limb: Secondary | ICD-10-CM | POA: Diagnosis not present

## 2016-10-16 DIAGNOSIS — G5601 Carpal tunnel syndrome, right upper limb: Secondary | ICD-10-CM | POA: Diagnosis not present

## 2016-10-16 DIAGNOSIS — G5602 Carpal tunnel syndrome, left upper limb: Secondary | ICD-10-CM | POA: Diagnosis not present

## 2016-11-05 DIAGNOSIS — G5601 Carpal tunnel syndrome, right upper limb: Secondary | ICD-10-CM | POA: Diagnosis not present

## 2016-11-05 DIAGNOSIS — G5603 Carpal tunnel syndrome, bilateral upper limbs: Secondary | ICD-10-CM | POA: Diagnosis not present

## 2016-11-05 DIAGNOSIS — G5602 Carpal tunnel syndrome, left upper limb: Secondary | ICD-10-CM | POA: Diagnosis not present

## 2016-11-19 DIAGNOSIS — M79641 Pain in right hand: Secondary | ICD-10-CM | POA: Diagnosis not present

## 2016-11-27 DIAGNOSIS — Z23 Encounter for immunization: Secondary | ICD-10-CM | POA: Diagnosis not present

## 2016-12-03 DIAGNOSIS — M79641 Pain in right hand: Secondary | ICD-10-CM | POA: Diagnosis not present

## 2016-12-06 DIAGNOSIS — Z1231 Encounter for screening mammogram for malignant neoplasm of breast: Secondary | ICD-10-CM | POA: Diagnosis not present

## 2016-12-06 LAB — HM MAMMOGRAPHY

## 2016-12-10 ENCOUNTER — Encounter: Payer: Self-pay | Admitting: Internal Medicine

## 2016-12-10 DIAGNOSIS — M79641 Pain in right hand: Secondary | ICD-10-CM | POA: Diagnosis not present

## 2016-12-16 DIAGNOSIS — N950 Postmenopausal bleeding: Secondary | ICD-10-CM | POA: Diagnosis not present

## 2016-12-16 DIAGNOSIS — M79641 Pain in right hand: Secondary | ICD-10-CM | POA: Diagnosis not present

## 2016-12-16 DIAGNOSIS — Z01419 Encounter for gynecological examination (general) (routine) without abnormal findings: Secondary | ICD-10-CM | POA: Diagnosis not present

## 2016-12-18 DIAGNOSIS — N841 Polyp of cervix uteri: Secondary | ICD-10-CM | POA: Diagnosis not present

## 2016-12-18 DIAGNOSIS — N840 Polyp of corpus uteri: Secondary | ICD-10-CM | POA: Diagnosis not present

## 2016-12-23 DIAGNOSIS — N840 Polyp of corpus uteri: Secondary | ICD-10-CM | POA: Diagnosis not present

## 2016-12-23 DIAGNOSIS — N950 Postmenopausal bleeding: Secondary | ICD-10-CM | POA: Diagnosis not present

## 2016-12-27 DIAGNOSIS — H1132 Conjunctival hemorrhage, left eye: Secondary | ICD-10-CM | POA: Diagnosis not present

## 2016-12-31 ENCOUNTER — Other Ambulatory Visit: Payer: Self-pay | Admitting: Obstetrics and Gynecology

## 2016-12-31 DIAGNOSIS — N958 Other specified menopausal and perimenopausal disorders: Secondary | ICD-10-CM | POA: Diagnosis not present

## 2016-12-31 DIAGNOSIS — N950 Postmenopausal bleeding: Secondary | ICD-10-CM | POA: Diagnosis not present

## 2016-12-31 DIAGNOSIS — N840 Polyp of corpus uteri: Secondary | ICD-10-CM | POA: Diagnosis not present

## 2017-02-06 DIAGNOSIS — L814 Other melanin hyperpigmentation: Secondary | ICD-10-CM | POA: Diagnosis not present

## 2017-02-06 DIAGNOSIS — L821 Other seborrheic keratosis: Secondary | ICD-10-CM | POA: Diagnosis not present

## 2017-02-06 DIAGNOSIS — D1801 Hemangioma of skin and subcutaneous tissue: Secondary | ICD-10-CM | POA: Diagnosis not present

## 2017-03-10 DIAGNOSIS — M79641 Pain in right hand: Secondary | ICD-10-CM | POA: Diagnosis not present

## 2017-03-10 DIAGNOSIS — G5603 Carpal tunnel syndrome, bilateral upper limbs: Secondary | ICD-10-CM | POA: Diagnosis not present

## 2017-03-10 DIAGNOSIS — M79642 Pain in left hand: Secondary | ICD-10-CM | POA: Diagnosis not present

## 2017-04-03 DIAGNOSIS — M5030 Other cervical disc degeneration, unspecified cervical region: Secondary | ICD-10-CM | POA: Diagnosis not present

## 2017-04-03 DIAGNOSIS — M542 Cervicalgia: Secondary | ICD-10-CM | POA: Diagnosis not present

## 2017-04-03 DIAGNOSIS — S134XXA Sprain of ligaments of cervical spine, initial encounter: Secondary | ICD-10-CM | POA: Diagnosis not present

## 2017-04-18 DIAGNOSIS — M542 Cervicalgia: Secondary | ICD-10-CM | POA: Diagnosis not present

## 2017-05-05 DIAGNOSIS — M5030 Other cervical disc degeneration, unspecified cervical region: Secondary | ICD-10-CM | POA: Diagnosis not present

## 2017-05-05 DIAGNOSIS — S134XXA Sprain of ligaments of cervical spine, initial encounter: Secondary | ICD-10-CM | POA: Diagnosis not present

## 2017-05-10 DIAGNOSIS — S134XXA Sprain of ligaments of cervical spine, initial encounter: Secondary | ICD-10-CM | POA: Diagnosis not present

## 2017-05-19 DIAGNOSIS — M5030 Other cervical disc degeneration, unspecified cervical region: Secondary | ICD-10-CM | POA: Diagnosis not present

## 2017-05-19 DIAGNOSIS — S134XXA Sprain of ligaments of cervical spine, initial encounter: Secondary | ICD-10-CM | POA: Diagnosis not present

## 2017-05-21 DIAGNOSIS — M5030 Other cervical disc degeneration, unspecified cervical region: Secondary | ICD-10-CM | POA: Diagnosis not present

## 2017-05-28 DIAGNOSIS — M5030 Other cervical disc degeneration, unspecified cervical region: Secondary | ICD-10-CM | POA: Diagnosis not present

## 2017-06-26 DIAGNOSIS — M5030 Other cervical disc degeneration, unspecified cervical region: Secondary | ICD-10-CM | POA: Diagnosis not present

## 2017-07-21 DIAGNOSIS — S134XXA Sprain of ligaments of cervical spine, initial encounter: Secondary | ICD-10-CM | POA: Diagnosis not present

## 2017-07-31 NOTE — Progress Notes (Signed)
Subjective:    Patient ID: [name], female    DOB: 1960/09/18, 57 y.o.   MRN: [id number]  HPI The patient is here for follow up.  She was last seen here about 18 months ago.  Since then she did have bilateral carpal tunnel surgery, which went well.  She also had postmenopausal bleeding was found to have a uterine polyp, which was benign.  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  She has not made some lifestyle changes we discussed 18 months ago-she states she would always say I will have time tomorrow.  She feels that I can do that tomorrow is finally done and she needs to start working on her own health and now has the time to do it.  This morning she used her elliptical and she has moved back to her room but it is more convenient to use.  She knows what she needs to do as far as eating.  She eats fairly healthy, but probably eats too much.   Migraine headaches: She still gets migraines 4-6 times a year.  She uses the antinausea migraine medication and they work well.  She does need a refill.  Poor sleep her fatigue is 1 of her more common triggers.    Medications and allergies reviewed with patient and updated if appropriate.  Patient Active Problem List   Diagnosis Date Noted  . Allergic rhinitis 03/26/2016  . Family history of colon cancer 02/24/2016  . Hyperglycemia 02/18/2016  . Hyperlipidemia 02/18/2016  . Stress incontinence 02/14/2016  . Cervical lymphadenopathy 02/14/2016  . Migraines 02/18/1993    No current outpatient medications on file prior to visit.   No current facility-administered medications on file prior to visit.     No past medical history on file.  Past Surgical History:  Procedure Laterality Date  . ACCESSORY NAVICULAR EXCISION     right  . JOINT REPLACEMENT     Right hip replacement    Social History   Socioeconomic History  . Marital status: Married    Spouse  name: Not on file  . Number of children: Not on file  . Years of education: Not on file  . Highest education level: Not on file  Occupational History  . Not on file  Social Needs  . Financial resource strain: Not on file  . Food insecurity:    Worry: Not on file    Inability: Not on file  . Transportation needs:    Medical: Not on file    Non-medical: Not on file  Tobacco Use  . Smoking status: Never Smoker  . Smokeless tobacco: Never Used  Substance and Sexual Activity  . Alcohol use: Yes    Comment: 1-2 glasses/week  . Drug use: No  . Sexual activity: Not on file  Lifestyle  . Physical activity:    Days per week: Not on file    Minutes per session: Not on file  . Stress: Not on file  Relationships  . Social connections:    Talks on phone: Not on file    Gets together: Not on file    Attends religious service: Not on file    Active member of club or organization: Not on file    Attends meetings of clubs or organizations: Not on file    Relationship status: Not on file  Other Topics Concern  . Not on file  Social History Narrative  . Not on file    Family History  Problem Relation Age of Onset  . Hypertension Mother   . Cancer Father 38       Colon    Review of Systems  Constitutional: Negative for fever.  Respiratory: Negative for cough, shortness of breath and wheezing.   Cardiovascular: Negative for chest pain, palpitations and leg swelling.  Gastrointestinal: Negative for abdominal pain.       No gerd  Neurological: Positive for headaches. Negative for dizziness and light-headedness.       Objective:   Vitals:   08/01/17 1133  BP: 128/80  Pulse: 93  Resp: 16  Temp: 98.7 F (37.1 C)  SpO2: 98%   BP Readings from Last 3 Encounters:  08/01/17 128/80  03/26/16 120/70  02/14/16 114/80   Wt Readings from Last 3 Encounters:  08/01/17 204 lb (92.5 kg)  03/26/16 195 lb (88.5 kg)  02/14/16 195 lb (88.5 kg)   Body mass index is 32.93 kg/m.    Physical Exam    Constitutional: Appears well-developed and well-nourished. No distress.  HENT:  Head: Normocephalic and atraumatic.  Neck: Neck supple. No tracheal deviation present. No thyromegaly present.  No cervical lymphadenopathy Cardiovascular: Normal rate, regular rhythm and normal heart sounds.   No murmur heard. No carotid bruit .  No edema Pulmonary/Chest: Effort normal and breath sounds normal. No respiratory distress. No has no wheezes. No rales.  Skin: Skin is warm and dry. Not diaphoretic.  Psychiatric: Normal mood and affect. Behavior is normal.      Assessment & Plan:    See Problem List for Assessment and Plan of chronic medical problems.

## 2017-08-01 ENCOUNTER — Encounter: Payer: Self-pay | Admitting: Internal Medicine

## 2017-08-01 ENCOUNTER — Ambulatory Visit (INDEPENDENT_AMBULATORY_CARE_PROVIDER_SITE_OTHER): Payer: 59 | Admitting: Internal Medicine

## 2017-08-01 VITALS — BP 128/80 | HR 93 | Temp 98.7°F | Resp 16 | Wt 204.0 lb

## 2017-08-01 DIAGNOSIS — G43109 Migraine with aura, not intractable, without status migrainosus: Secondary | ICD-10-CM | POA: Diagnosis not present

## 2017-08-01 DIAGNOSIS — E782 Mixed hyperlipidemia: Secondary | ICD-10-CM

## 2017-08-01 DIAGNOSIS — R739 Hyperglycemia, unspecified: Secondary | ICD-10-CM | POA: Diagnosis not present

## 2017-08-01 MED ORDER — ONDANSETRON HCL 4 MG PO TABS: 4.0000 mg | ORAL_TABLET | Freq: Three times a day (TID) | ORAL | 0 refills | Status: DC | PRN

## 2017-08-01 MED ORDER — SUMATRIPTAN 20 MG/ACT NA SOLN: 20.0000 mg | NASAL | 5 refills | Status: DC | PRN

## 2017-08-01 NOTE — Assessment & Plan Note (Signed)
History of hyperlipidemia She has not made lifestyle changes, but plans on making them over the next few months Has started exercising regularly and will continue Will work on diet changes and weight loss Will follow-up in 3 months with blood work prior

## 2017-08-01 NOTE — Patient Instructions (Signed)
Have blood work a few days before your next appointment.   Medications reviewed and updated.  No changes recommended at this time.  Your prescription(s) have been submitted to your pharmacy. Please take as directed and contact our office if you believe you are having problem(s) with the medication(s).   Please followup in 3 months

## 2017-08-01 NOTE — Assessment & Plan Note (Addendum)
4-6 times a year - goes in cycles Current medication effective. Medications refilled

## 2017-08-01 NOTE — Assessment & Plan Note (Signed)
Has had some hyperglycemia Will be working on lifestyle changes over the next 3 months Will check A1c prior to her next visit

## 2017-08-18 DIAGNOSIS — M6530 Trigger finger, unspecified finger: Secondary | ICD-10-CM | POA: Diagnosis not present

## 2017-09-17 ENCOUNTER — Ambulatory Visit (INDEPENDENT_AMBULATORY_CARE_PROVIDER_SITE_OTHER): Payer: 59

## 2017-09-17 DIAGNOSIS — M65312 Trigger thumb, left thumb: Secondary | ICD-10-CM | POA: Diagnosis not present

## 2017-09-17 DIAGNOSIS — Z111 Encounter for screening for respiratory tuberculosis: Secondary | ICD-10-CM | POA: Diagnosis not present

## 2017-09-17 DIAGNOSIS — M79645 Pain in left finger(s): Secondary | ICD-10-CM | POA: Diagnosis not present

## 2017-09-19 ENCOUNTER — Encounter: Payer: Self-pay | Admitting: Family

## 2017-09-19 ENCOUNTER — Ambulatory Visit (INDEPENDENT_AMBULATORY_CARE_PROVIDER_SITE_OTHER): Payer: 59 | Admitting: Family

## 2017-09-19 VITALS — BP 124/78 | HR 121 | Temp 98.0°F | Ht 66.0 in | Wt 208.0 lb

## 2017-09-19 DIAGNOSIS — Z021 Encounter for pre-employment examination: Secondary | ICD-10-CM

## 2017-09-19 DIAGNOSIS — G43109 Migraine with aura, not intractable, without status migrainosus: Secondary | ICD-10-CM | POA: Diagnosis not present

## 2017-09-19 NOTE — Progress Notes (Signed)
  [name] is a 57 y.o. female with the following history as recorded in [employer]:  Patient Active Problem List   Diagnosis Date Noted  . Allergic rhinitis 03/26/2016  . Family history of colon cancer 02/24/2016  . Hyperglycemia 02/18/2016  . Hyperlipidemia 02/18/2016  . Stress incontinence 02/14/2016  . Cervical lymphadenopathy 02/14/2016  . Migraines 02/18/1993    Current Outpatient Medications  Medication Sig Dispense Refill  . ondansetron (ZOFRAN) 4 MG tablet Take 1 tablet (4 mg total) by mouth every 8 (eight) hours as needed for nausea (with migraines). 20 tablet 0  . SUMAtriptan (IMITREX) 20 MG/ACT nasal spray Place 1 spray (20 mg total) into the nose every 2 (two) hours as needed for migraine or headache. May repeat in 2 hrs if headache persists 1 Inhaler 5   No current facility-administered medications for this visit.     Allergies: Patient has no known allergies.  History reviewed. No pertinent past medical history.  Past Surgical History:  Procedure Laterality Date  . ACCESSORY NAVICULAR EXCISION     right  . JOINT REPLACEMENT     Right hip replacement    Family History  Problem Relation Age of Onset  . Hypertension Mother   . Cancer Father 39       Colon    Social History   Tobacco Use  . Smoking status: Never Smoker  . Smokeless tobacco: Never Used  Substance Use Topics  . 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 Will get labs prior to that appointment;    Objective:  Vitals:   09/19/17 1323  BP: 124/78  Pulse: (!) 121  Temp: 98 F (36.7  C)  TempSrc: Oral  SpO2: 95%  Weight: 208 lb 0.6 oz (94.4 kg)  Height: _0  (1.676 m)    General: Well developed, well nourished, in no acute distress  Skin : Warm and dry.  Head: Normocephalic and atraumatic  Eyes: Sclera and conjunctiva clear; pupils round and reactive to light; extraocular movements intact  Ears: External normal; canals clear; tympanic membranes normal  Lungs: Respirations unlabored; clear to auscultation bilaterally without wheeze, rales, rhonchi  CVS exam: normal rate and regular rhythm.  Neurologic: Alert and oriented; speech intact; face symmetrical; moves all extremities well; CNII-XII intact without focal deficit   Assessment:  1. Migraine with aura and without status migrainosus, not intractable   2. Encounter for pre-employment examination     Plan:  1. Stable; continue same medications; 2. Cleared for participation with no concerns; PPD negative; Tdap up to date;   Keep planned follow-up for CPE for November 2019.  No follow-ups on file.  No orders of the defined types were placed in this encounter.   Requested Prescriptions    No prescriptions requested or ordered in this encounter

## 2017-10-04 IMAGING — US US SOFT TISSUE HEAD/NECK
1 series · 14 of 17 positions shown · non-contrast
Comparison: None.

CLINICAL DATA: Cervical lymphadenopathy.

EXAM:
ULTRASOUND OF HEAD/NECK SOFT TISSUES
TECHNIQUE: Ultrasound examination of the head and neck soft tissues was
performed in the area of clinical concern.

[Series 1: us soft tissue head/neck · 0.08mm/px · 14 of 17 slices shown]
[im 1/17]
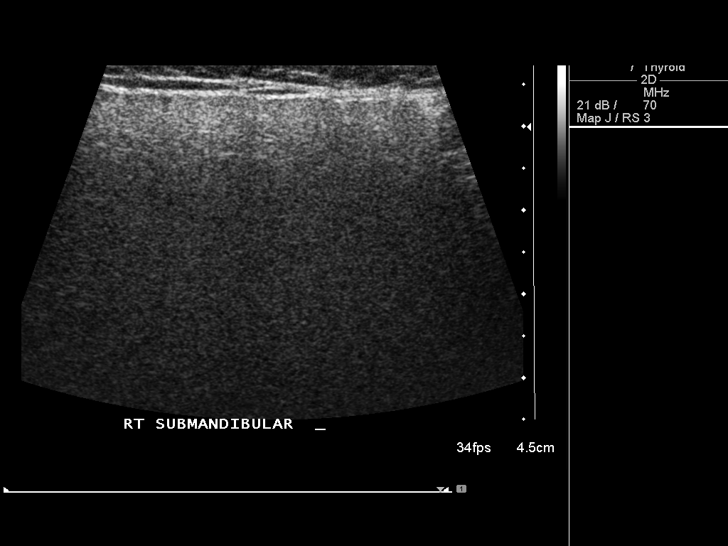
[im 2/17]
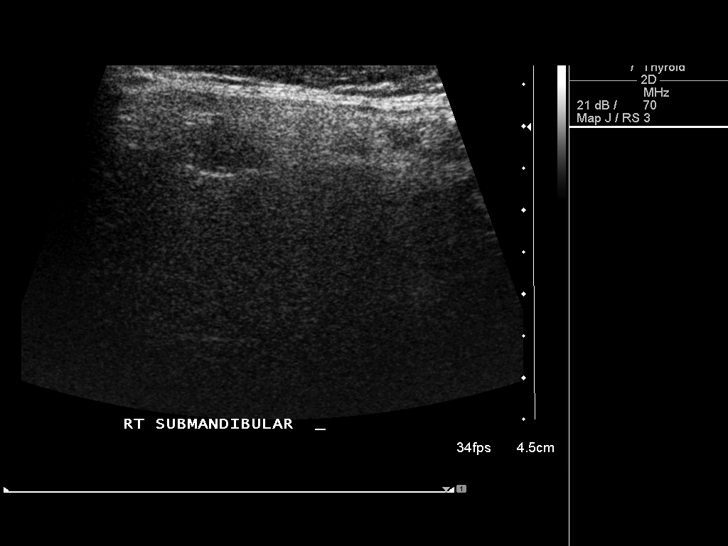
[im 4/17]
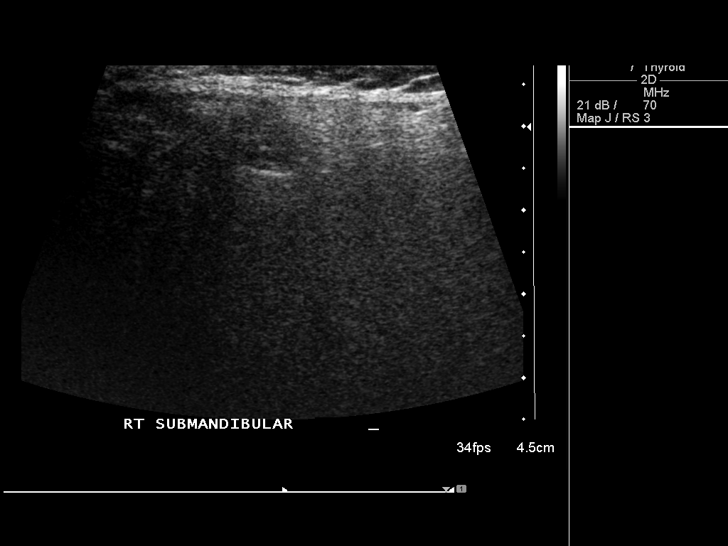
[im 5/17]
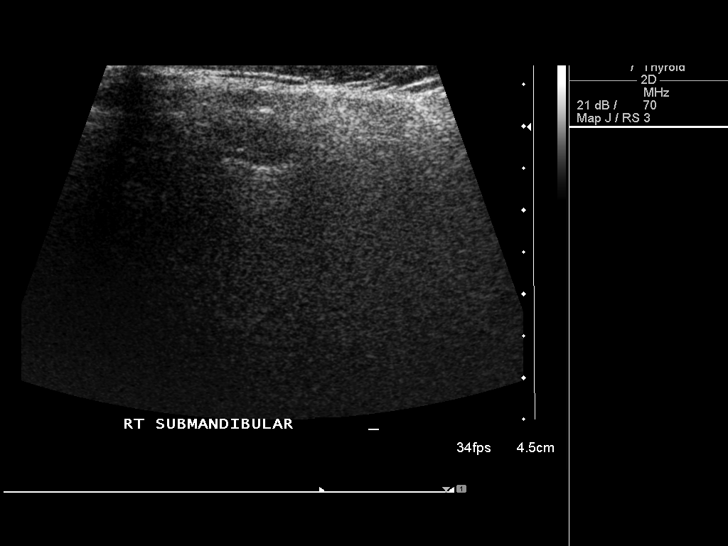
[im 6/17]
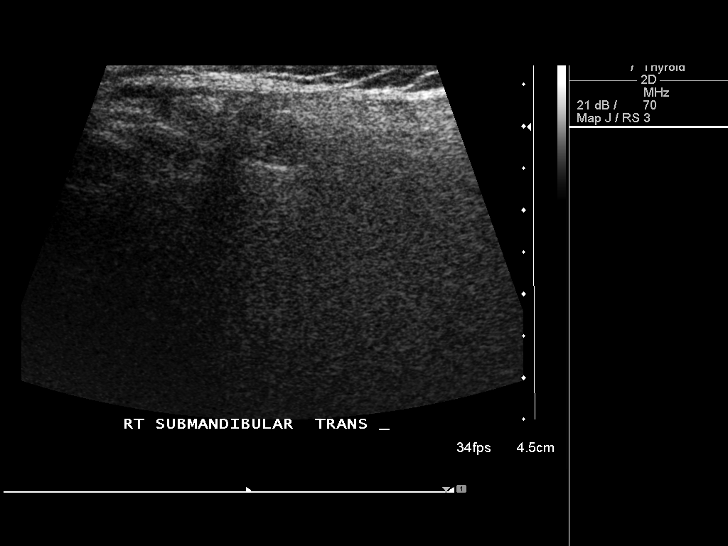
[im 7/17]
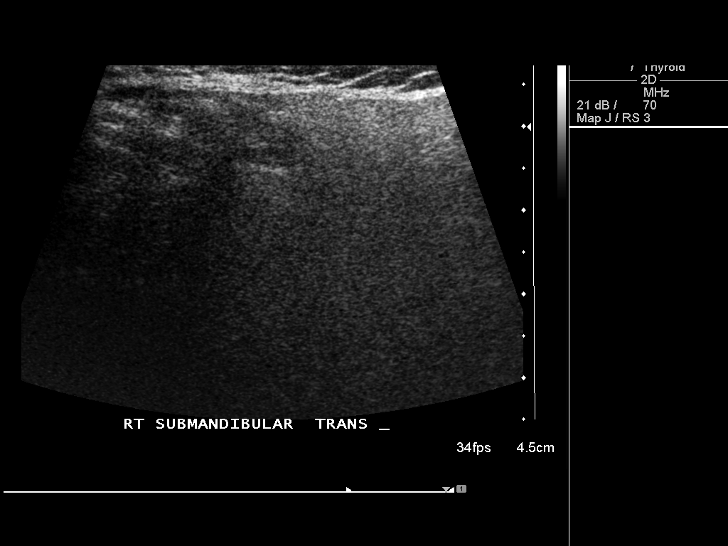
[im 8/17]
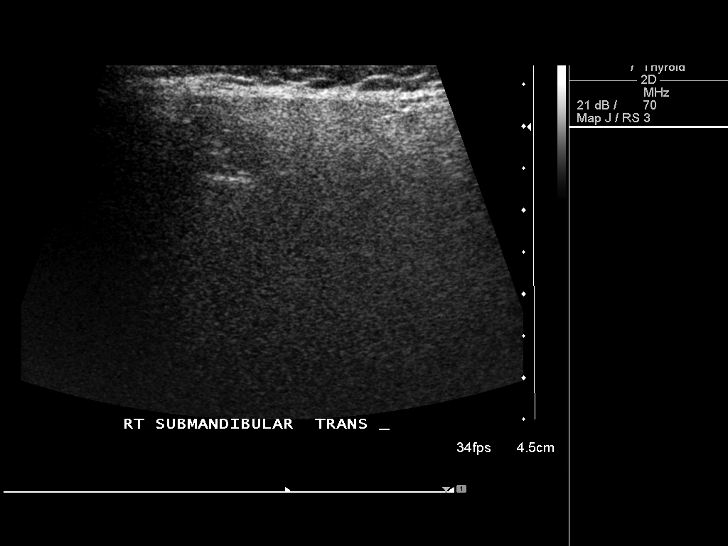
[im 10/17]
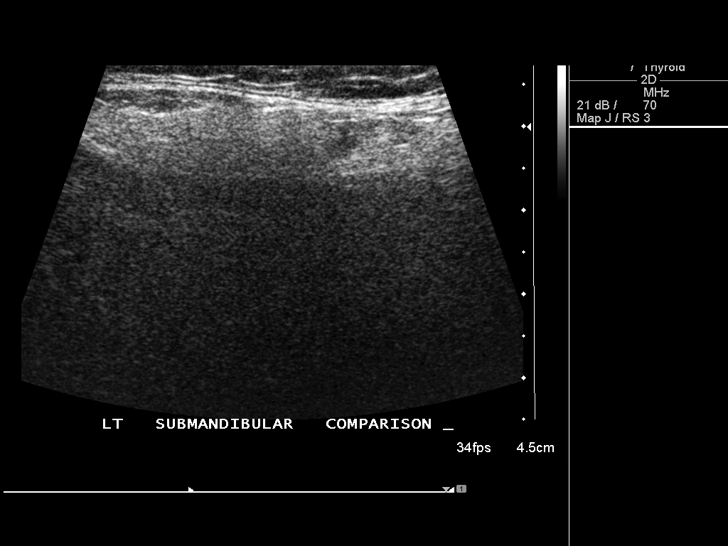
[im 11/17]
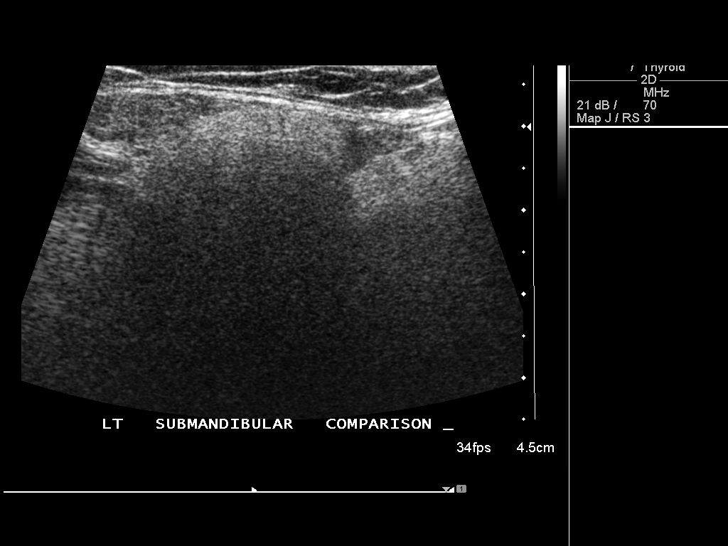
[im 12/17]
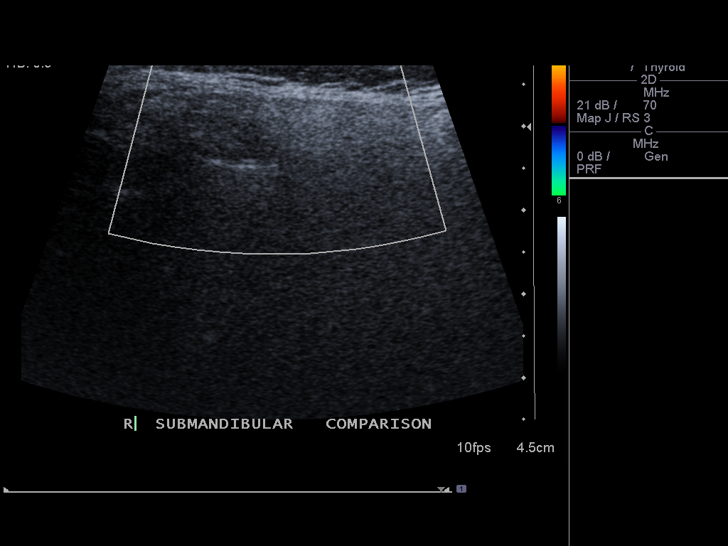
[im 13/17]
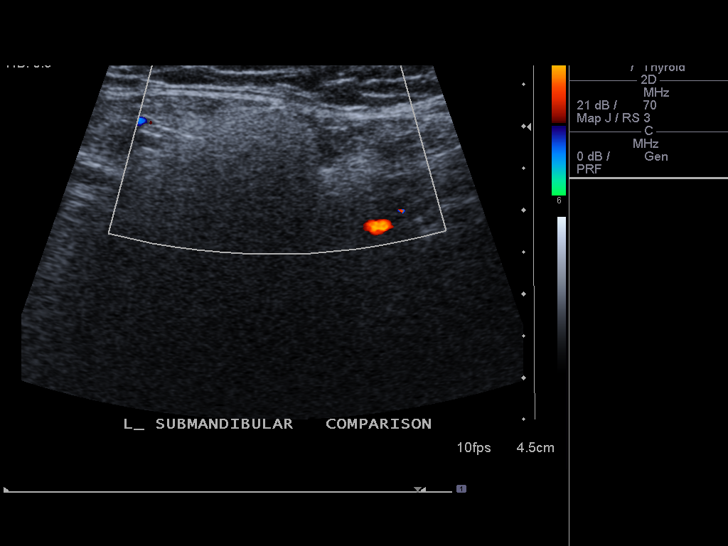
[im 14/17]
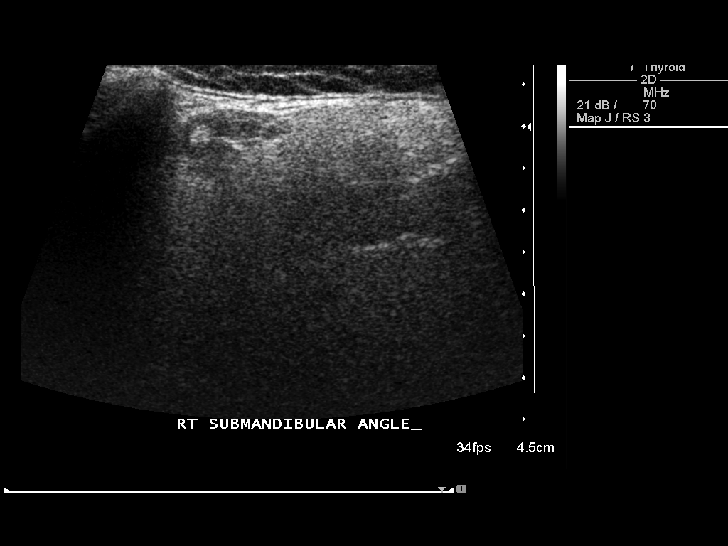
[im 16/17]
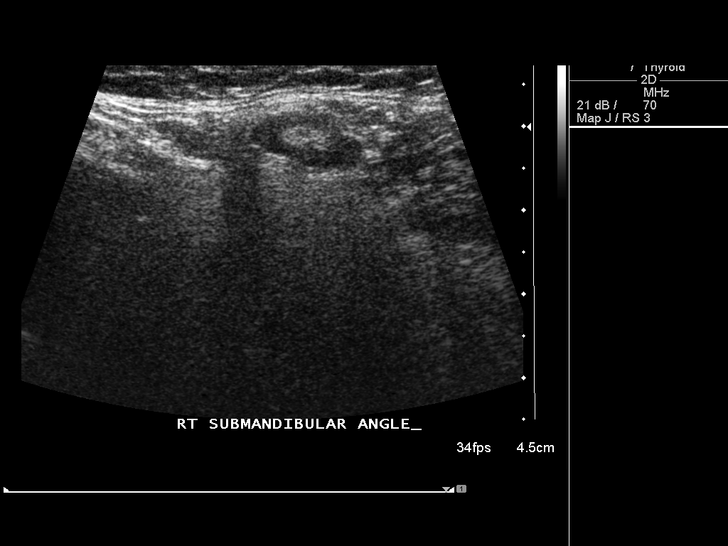
[im 17/17]
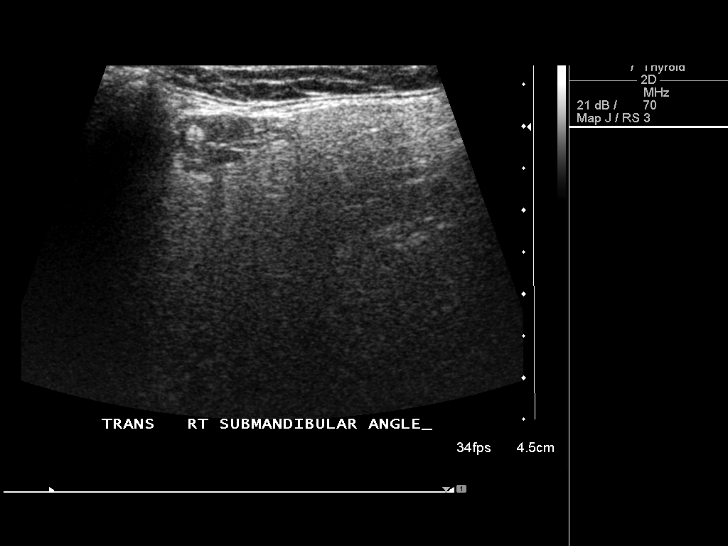

[14 of 17 positions shown; findings below may reference images not displayed]

FINDINGS: A benign appearing non pathologically enlarged lymph node correlates
with the patient's palpable area of concern involving the right
submandibular region of the neck. The lymph node measures
approximately 0.7 cm in greatest short axis diameter and maintains a
benign fatty hilum (image 16).
IMPRESSION: A benign appearing non pathologically enlarged lymph node correlates
with the patient's palpable area of concern involving the
submandibular region of the right-side of the neck.

## 2017-11-04 ENCOUNTER — Encounter: Payer: 59 | Admitting: Internal Medicine

## 2017-12-16 LAB — HM MAMMOGRAPHY

## 2017-12-23 ENCOUNTER — Encounter: Payer: Self-pay | Admitting: Internal Medicine

## 2017-12-29 ENCOUNTER — Encounter: Payer: 59 | Admitting: Internal Medicine

## 2017-12-30 ENCOUNTER — Encounter: Payer: Self-pay | Admitting: Internal Medicine

## 2017-12-30 NOTE — Telephone Encounter (Signed)
Documented flu shot.../LMB  

## 2018-02-20 ENCOUNTER — Encounter: Payer: 59 | Admitting: Internal Medicine

## 2018-04-29 NOTE — Progress Notes (Signed)
Subjective:    Patient ID: [name], female    DOB: 1960-05-28, 58 y.o.   MRN: [id number]  HPI She is here for a physical exam.   Since she was here she has gained weight.  Her goal when she is on the last was made lifestyle changes, eat better and lose weight.  She has not done this she now admits that she needs help.  She has done the [employer] in the past and it was effective.  She has a sweet tooth and now she eats too much.  She is not currently exercising.  She will be starting a new job next week and feels this may help her develop better habits.  She otherwise feels well and has no concerns.  She would ideally like to wait a little while to do blood work so that she can try to make some lifestyle changes first.  Medications and allergies reviewed with patient and updated if appropriate.  Patient Active Problem List   Diagnosis Date Noted  . Allergic rhinitis 03/26/2016  . Family history of colon cancer 02/24/2016  . Hyperglycemia 02/18/2016  . Hyperlipidemia 02/18/2016  . Stress incontinence 02/14/2016  . Migraines 02/18/1993    Current Outpatient Medications on File Prior to Visit  Medication Sig Dispense Refill  . PAZEO 0.7 % SOLN      No current facility-administered medications on file prior to visit.     No past medical history on file.  Past Surgical History:  Procedure Laterality Date  . ACCESSORY NAVICULAR EXCISION     right  . JOINT REPLACEMENT     Right hip replacement    Social History   Socioeconomic History  . Marital status: Married    Spouse name: Not on file  . Number of children: Not on file  . Years of education: Not on file  . Highest education level: Not on file  Occupational History  . Not on file  Social Needs  . Financial resource strain: Not on file  . Food insecurity:    Worry: Not on file    Inability: Not on file  . Transportation needs:    Medical: Not on file    Non-medical: Not on file  Tobacco Use  .  Smoking status: Never Smoker  . Smokeless tobacco: Never Used  Substance and Sexual Activity  . Alcohol use: Yes    Comment: 1-2 glasses/week  . Drug use: No  . Sexual activity: Not on file  Lifestyle  . Physical activity:    Days per week: Not on file    Minutes per session: Not on file  . Stress: Not on file  Relationships  . Social connections:    Talks on phone: Not on file    Gets together: Not on file    Attends religious service: Not on file    Active member of club or organization: Not on file    Attends meetings of clubs or organizations: Not on file    Relationship status: Not on file  Other Topics Concern  . Not on file  Social History Narrative  . Not on file    Family History  Problem Relation Age of Onset  . Hypertension Mother   . Cancer Father 63       Colon    Review of Systems  Constitutional: Negative for chills and fever.  Eyes: Negative for visual disturbance.  Respiratory: Negative for cough, shortness of breath and wheezing.  Cardiovascular: Negative for chest pain, palpitations and leg swelling.  Gastrointestinal: Negative for abdominal pain, blood in stool, constipation, diarrhea and nausea.  Genitourinary: Negative for dysuria and hematuria.       Mild urinary incontinence  Musculoskeletal: Positive for myalgias (Cramping intermittently in the right thigh for 6 months-?  Related to prior total hip replacement). Negative for arthralgias.  Skin: Negative for color change and rash.  Neurological: Positive for headaches (Occasional migraines). Negative for dizziness and light-headedness.  Psychiatric/Behavioral: Negative for dysphoric mood. The patient is not nervous/anxious.        Objective:   Vitals:   04/30/18 0901  BP: 128/84  Pulse: 89  Resp: 16  Temp: 98.4 F (36.9 C)  SpO2: 96%   Filed Weights   04/30/18 0901  Weight: 208 lb 12.8 oz (94.7 kg)   Body mass index is 33.7 kg/m.  BP Readings from Last 3 Encounters:  04/30/18  128/84  09/19/17 124/78  08/01/17 128/80    Wt Readings from Last 3 Encounters:  04/30/18 208 lb 12.8 oz (94.7 kg)  09/19/17 208 lb 0.6 oz (94.4 kg)  08/01/17 204 lb (92.5 kg)     Physical Exam Constitutional: She appears well-developed and well-nourished. No distress.  HENT:  Head: Normocephalic and atraumatic.  Right Ear: External ear normal. Normal ear canal and TM Left Ear: External ear normal.  Normal ear canal and TM Mouth/Throat: Oropharynx is clear and moist.  Eyes: Conjunctivae and EOM are normal.  Neck: Neck supple. No tracheal deviation present. No thyromegaly present.  No carotid bruit  Cardiovascular: Normal rate, regular rhythm and normal heart sounds.   No murmur heard.  No edema. Pulmonary/Chest: Effort normal and breath sounds normal. No respiratory distress. She has no wheezes. She has no rales.  Breast: deferred to Gyn Abdominal: Soft. She exhibits no distension. There is no tenderness.  Lymphadenopathy: She has no cervical adenopathy.  Skin: Skin is warm and dry. She is not diaphoretic.  Psychiatric: She has a normal mood and affect. Her behavior is normal.        Assessment & Plan:   Physical exam: Screening blood work ordered-to be done in approximately 3 weeks Immunizations   Discussed shingrix, others up to date Colonoscopy  Up to date  Mammogram   Up to date  Gyn up-to-date Eye exams up-to-date EKG  None on file Exercise-no current exercise.  Stressed the importance of regular exercise. Weight-discussed weight loss at length.  She will start the [employer] again, which has been effective for her in the past.  Discussed the importance of exercise as well. Skin no concerns, seen in dermatology today Substance abuse   none  See Problem List for Assessment and Plan of chronic medical problems.   Follow-up in approximately 1 month

## 2018-04-29 NOTE — Patient Instructions (Addendum)
Blood work to be done in approximately 3 weeks. Tests ordered today. Your results will be released to [facility] (or called to you) after review, usually within 72hours after test completion. If any changes need to be made, you will be notified at that same time.  All other Health Maintenance issues reviewed.   All recommended immunizations and age-appropriate screenings are up-to-date or discussed.  No immunizations administered today.   Medications reviewed and updated.  Changes include :   None  Your prescription(s) have been submitted to your pharmacy. Please take as directed and contact our office if you believe you are having problem(s) with the medication(s).   Please followup in 1 month   Health Maintenance, Female Adopting a healthy lifestyle and getting preventive care can go a long way to promote health and wellness. Talk with your health care provider about what schedule of regular examinations is right for you. This is a good chance for you to check in with your provider about disease prevention and staying healthy. In between checkups, there are plenty of things you can do on your own. Experts have done a lot of research about which lifestyle changes and preventive measures are most likely to keep you healthy. Ask your health care provider for more information. Weight and diet Eat a healthy diet  Be sure to include plenty of vegetables, fruits, low-fat dairy products, and lean protein.  Do not eat a lot of foods high in solid fats, added sugars, or salt.  Get regular exercise. This is one of the most important things you can do for your health. ? Most adults should exercise for at least 150 minutes each week. The exercise should increase your heart rate and make you sweat (moderate-intensity exercise). ? Most adults should also do strengthening exercises at least twice a week. This is in addition to the moderate-intensity exercise. Maintain a healthy weight  Body mass index  (BMI) is a measurement that can be used to identify possible weight problems. It estimates body fat based on height and weight. Your health care provider can help determine your BMI and help you achieve or maintain a healthy weight.  For females 70 years of age and older: ? A BMI below 18.5 is considered underweight. ? A BMI of 18.5 to 24.9 is normal. ? A BMI of 25 to 29.9 is considered overweight. ? A BMI of 30 and above is considered obese. Watch levels of cholesterol and blood lipids  You should start having your blood tested for lipids and cholesterol at 57 years of age, then have this test every 5 years.  You may need to have your cholesterol levels checked more often if: ? Your lipid or cholesterol levels are high. ? You are older than 57 years of age. ? You are at high risk for heart disease. Cancer screening Lung Cancer  Lung cancer screening is recommended for adults 39-17 years old who are at high risk for lung cancer because of a history of smoking.  A yearly low-dose CT scan of the lungs is recommended for people who: ? Currently smoke. ? Have quit within the past 15 years. ? Have at least a 30-pack-year history of smoking. A pack year is smoking an average of one pack of cigarettes a day for 1 year.  Yearly screening should continue until it has been 15 years since you quit.  Yearly screening should stop if you develop a health problem that would prevent you from having lung cancer treatment. Breast  Cancer  Practice breast self-awareness. This means understanding how your breasts normally appear and feel.  It also means doing regular breast self-exams. Let your health care provider know about any changes, no matter how small.  If you are in your 20s or 30s, you should have a clinical breast exam (CBE) by a health care provider every 1-3 years as part of a regular health exam.  If you are 62 or older, have a CBE every year. Also consider having a breast X-ray  (mammogram) every year.  If you have a family history of breast cancer, talk to your health care provider about genetic screening.  If you are at high risk for breast cancer, talk to your health care provider about having an MRI and a mammogram every year.  Breast cancer gene (BRCA) assessment is recommended for women who have family members with BRCA-related cancers. BRCA-related cancers include: ? Breast. ? Ovarian. ? Tubal. ? Peritoneal cancers.  Results of the assessment will determine the need for genetic counseling and BRCA1 and BRCA2 testing. Cervical Cancer Your health care provider may recommend that you be screened regularly for cancer of the pelvic organs (ovaries, uterus, and vagina). This screening involves a pelvic examination, including checking for microscopic changes to the surface of your cervix (Pap test). You may be encouraged to have this screening done every 3 years, beginning at age 73.  For women ages 25-65, health care providers may recommend pelvic exams and Pap testing every 3 years, or they may recommend the Pap and pelvic exam, combined with testing for human papilloma virus (HPV), every 5 years. Some types of HPV increase your risk of cervical cancer. Testing for HPV may also be done on women of any age with unclear Pap test results.  Other health care providers may not recommend any screening for nonpregnant women who are considered low risk for pelvic cancer and who do not have symptoms. Ask your health care provider if a screening pelvic exam is right for you.  If you have had past treatment for cervical cancer or a condition that could lead to cancer, you need Pap tests and screening for cancer for at least 20 years after your treatment. If Pap tests have been discontinued, your risk factors (such as having a new sexual partner) need to be reassessed to determine if screening should resume. Some women have medical problems that increase the chance of getting  cervical cancer. In these cases, your health care provider may recommend more frequent screening and Pap tests. Colorectal Cancer  This type of cancer can be detected and often prevented.  Routine colorectal cancer screening usually begins at 57 years of age and continues through 57 years of age.  Your health care provider may recommend screening at an earlier age if you have risk factors for colon cancer.  Your health care provider may also recommend using home test kits to check for hidden blood in the stool.  A small camera at the end of a tube can be used to examine your colon directly (sigmoidoscopy or colonoscopy). This is done to check for the earliest forms of colorectal cancer.  Routine screening usually begins at age 74.  Direct examination of the colon should be repeated every 5-10 years through 57 years of age. However, you may need to be screened more often if early forms of precancerous polyps or small growths are found. Skin Cancer  Check your skin from head to toe regularly.  Tell your health care provider  about any new moles or changes in moles, especially if there is a change in a mole's shape or color.  Also tell your health care provider if you have a mole that is larger than the size of a pencil eraser.  Always use sunscreen. Apply sunscreen liberally and repeatedly throughout the day.  Protect yourself by wearing long sleeves, pants, a wide-brimmed hat, and sunglasses whenever you are outside. Heart disease, diabetes, and high blood pressure  High blood pressure causes heart disease and increases the risk of stroke. High blood pressure is more likely to develop in: ? People who have blood pressure in the high end of the normal range (130-139/85-89 mm Hg). ? People who are overweight or obese. ? People who are African American.  If you are 21-8 years of age, have your blood pressure checked every 3-5 years. If you are 26 years of age or older, have your blood  pressure checked every year. You should have your blood pressure measured twice-once when you are at a hospital or clinic, and once when you are not at a hospital or clinic. Record the average of the two measurements. To check your blood pressure when you are not at a hospital or clinic, you can use: ? An automated blood pressure machine at a pharmacy. ? A home blood pressure monitor.  If you are between 92 years and 51 years old, ask your health care provider if you should take aspirin to prevent strokes.  Have regular diabetes screenings. This involves taking a blood sample to check your fasting blood sugar level. ? If you are at a normal weight and have a low risk for diabetes, have this test once every three years after 57 years of age. ? If you are overweight and have a high risk for diabetes, consider being tested at a younger age or more often. Preventing infection Hepatitis B  If you have a higher risk for hepatitis B, you should be screened for this virus. You are considered at high risk for hepatitis B if: ? You were born in a country where hepatitis B is common. Ask your health care provider which countries are considered high risk. ? Your parents were born in a high-risk country, and you have not been immunized against hepatitis B (hepatitis B vaccine). ? You have HIV or AIDS. ? You use needles to inject street drugs. ? You live with someone who has hepatitis B. ? You have had sex with someone who has hepatitis B. ? You get hemodialysis treatment. ? You take certain medicines for conditions, including cancer, organ transplantation, and autoimmune conditions. Hepatitis C  Blood testing is recommended for: ? Everyone born from [birth year] through 1964. ? Anyone with known risk factors for hepatitis C. Sexually transmitted infections (STIs)  You should be screened for sexually transmitted infections (STIs) including gonorrhea and chlamydia if: ? You are sexually active and are younger  than 57 years of age. ? You are older than 57 years of age and your health care provider tells you that you are at risk for this type of infection. ? Your sexual activity has changed since you were last screened and you are at an increased risk for chlamydia or gonorrhea. Ask your health care provider if you are at risk.  If you do not have HIV, but are at risk, it may be recommended that you take a prescription medicine daily to prevent HIV infection. This is called pre-exposure prophylaxis (PrEP). You are considered at risk  if: ? You are sexually active and do not regularly use condoms or know the HIV status of your partner(s). ? You take drugs by injection. ? You are sexually active with a partner who has HIV. Talk with your health care provider about whether you are at high risk of being infected with HIV. If you choose to begin PrEP, you should first be tested for HIV. You should then be tested every 3 months for as long as you are taking PrEP. Pregnancy  If you are premenopausal and you may become pregnant, ask your health care provider about preconception counseling.  If you may become pregnant, take 400 to 800 micrograms (mcg) of folic acid every day.  If you want to prevent pregnancy, talk to your health care provider about birth control (contraception). Osteoporosis and menopause  Osteoporosis is a disease in which the bones lose minerals and strength with aging. This can result in serious bone fractures. Your risk for osteoporosis can be identified using a bone density scan.  If you are 23 years of age or older, or if you are at risk for osteoporosis and fractures, ask your health care provider if you should be screened.  Ask your health care provider whether you should take a calcium or vitamin D supplement to lower your risk for osteoporosis.  Menopause may have certain physical symptoms and risks.  Hormone replacement therapy may reduce some of these symptoms and risks. Talk  to your health care provider about whether hormone replacement therapy is right for you. Follow these instructions at home:  Schedule regular health, dental, and eye exams.  Stay current with your immunizations.  Do not use any tobacco products including cigarettes, chewing tobacco, or electronic cigarettes.  If you are pregnant, do not drink alcohol.  If you are breastfeeding, limit how much and how often you drink alcohol.  Limit alcohol intake to no more than 1 drink per day for nonpregnant women. One drink equals 12 ounces of beer, 5 ounces of wine, or 1 ounces of hard liquor.  Do not use street drugs.  Do not share needles.  Ask your health care provider for help if you need support or information about quitting drugs.  Tell your health care provider if you often feel depressed.  Tell your health care provider if you have ever been abused or do not feel safe at home. This information is not intended to replace advice given to you by your health care provider. Make sure you discuss any questions you have with your health care provider. Document Released: 08/20/2010 Document Revised: 07/13/2015 Document Reviewed: 11/08/2014 Elsevier Interactive Patient Education  2019 [employer].

## 2018-04-30 ENCOUNTER — Encounter: Payer: Self-pay | Admitting: Internal Medicine

## 2018-04-30 ENCOUNTER — Other Ambulatory Visit: Payer: Self-pay

## 2018-04-30 ENCOUNTER — Ambulatory Visit (INDEPENDENT_AMBULATORY_CARE_PROVIDER_SITE_OTHER): Payer: BC Managed Care – PPO | Admitting: Internal Medicine

## 2018-04-30 VITALS — BP 128/84 | HR 89 | Temp 98.4°F | Resp 16 | Ht 66.0 in | Wt 208.8 lb

## 2018-04-30 DIAGNOSIS — E6609 Other obesity due to excess calories: Secondary | ICD-10-CM

## 2018-04-30 DIAGNOSIS — E782 Mixed hyperlipidemia: Secondary | ICD-10-CM | POA: Diagnosis not present

## 2018-04-30 DIAGNOSIS — E669 Obesity, unspecified: Secondary | ICD-10-CM | POA: Insufficient documentation

## 2018-04-30 DIAGNOSIS — Z6833 Body mass index (BMI) 33.0-33.9, adult: Secondary | ICD-10-CM

## 2018-04-30 DIAGNOSIS — R739 Hyperglycemia, unspecified: Secondary | ICD-10-CM

## 2018-04-30 DIAGNOSIS — G43109 Migraine with aura, not intractable, without status migrainosus: Secondary | ICD-10-CM

## 2018-04-30 DIAGNOSIS — Z0000 Encounter for general adult medical examination without abnormal findings: Secondary | ICD-10-CM

## 2018-04-30 MED ORDER — ONDANSETRON HCL 4 MG PO TABS: 4.0000 mg | ORAL_TABLET | Freq: Three times a day (TID) | ORAL | 0 refills | Status: DC | PRN

## 2018-04-30 MED ORDER — SUMATRIPTAN 20 MG/ACT NA SOLN: 20.0000 mg | NASAL | 5 refills | Status: DC | PRN

## 2018-04-30 NOTE — Assessment & Plan Note (Signed)
Check a1c Low sugar / carb diet Stressed regular exercise Discussed weight loss

## 2018-04-30 NOTE — Assessment & Plan Note (Signed)
Discussed weight loss at length She has no comorbidity She will start [employer], which has been effective for her in the past She will start exercising She will follow-up in 1 month so that we can follow-up on her lifestyle changes and review her blood work that she will get done in approximately 3 weeks

## 2018-04-30 NOTE — Assessment & Plan Note (Signed)
Check lipid panel, TSH, CMP Low-fat/cholesterol diet Start regular exercise Work on weight loss

## 2018-04-30 NOTE — Assessment & Plan Note (Signed)
Migraines overall controlled Take sumatriptan as needed, which is very effective Continue

## 2018-05-08 LAB — HM COLONOSCOPY

## 2018-06-03 ENCOUNTER — Encounter: Payer: Self-pay | Admitting: Internal Medicine

## 2018-06-08 ENCOUNTER — Ambulatory Visit: Payer: BC Managed Care – PPO | Admitting: Internal Medicine

## 2018-07-23 ENCOUNTER — Ambulatory Visit: Payer: BC Managed Care – PPO | Admitting: Internal Medicine

## 2018-08-22 ENCOUNTER — Other Ambulatory Visit: Payer: Self-pay | Admitting: Internal Medicine

## 2018-08-27 ENCOUNTER — Ambulatory Visit: Payer: BC Managed Care – PPO | Admitting: Internal Medicine

## 2018-11-03 ENCOUNTER — Encounter: Payer: Self-pay | Admitting: Internal Medicine

## 2018-11-03 ENCOUNTER — Other Ambulatory Visit (INDEPENDENT_AMBULATORY_CARE_PROVIDER_SITE_OTHER): Payer: 59

## 2018-11-03 DIAGNOSIS — E782 Mixed hyperlipidemia: Secondary | ICD-10-CM | POA: Diagnosis not present

## 2018-11-03 DIAGNOSIS — R739 Hyperglycemia, unspecified: Secondary | ICD-10-CM | POA: Diagnosis not present

## 2018-11-03 DIAGNOSIS — Z0000 Encounter for general adult medical examination without abnormal findings: Secondary | ICD-10-CM

## 2018-11-03 LAB — CBC WITH DIFFERENTIAL/PLATELET
Basophils Absolute: 0.1 10*3/uL (ref 0.0–0.1)
Basophils Relative: 1.1 % (ref 0.0–3.0)
Eosinophils Absolute: 0.2 10*3/uL (ref 0.0–0.7)
Eosinophils Relative: 2.4 % (ref 0.0–5.0)
HCT: 42.3 % (ref 36.0–46.0)
Hemoglobin: 14.1 g/dL (ref 12.0–15.0)
Lymphocytes Relative: 34.5 % (ref 12.0–46.0)
Lymphs Abs: 2.9 10*3/uL (ref 0.7–4.0)
MCHC: 33.4 g/dL (ref 30.0–36.0)
MCV: 87.7 fl (ref 78.0–100.0)
Monocytes Absolute: 0.4 10*3/uL (ref 0.1–1.0)
Monocytes Relative: 4.7 % (ref 3.0–12.0)
Neutro Abs: 4.9 10*3/uL (ref 1.4–7.7)
Neutrophils Relative %: 57.3 % (ref 43.0–77.0)
Platelets: 308 10*3/uL (ref 150.0–400.0)
RBC: 4.83 Mil/uL (ref 3.87–5.11)
RDW: 14.3 % (ref 11.5–15.5)
WBC: 8.5 10*3/uL (ref 4.0–10.5)

## 2018-11-03 LAB — LIPID PANEL
Cholesterol: 262 mg/dL — ABNORMAL HIGH (ref 0–200)
HDL: 40.2 mg/dL (ref >39.00)
NonHDL: 221.92
Total CHOL/HDL Ratio: 7
Triglycerides: 219 mg/dL — ABNORMAL HIGH (ref 0.0–149.0)
VLDL: 43.8 mg/dL — ABNORMAL HIGH (ref 0.0–40.0)

## 2018-11-03 LAB — COMPREHENSIVE METABOLIC PANEL
ALT: 97 U/L — ABNORMAL HIGH (ref 0–35)
AST: 31 U/L (ref 0–37)
Albumin: 4.3 g/dL (ref 3.5–5.2)
Alkaline Phosphatase: 119 U/L — ABNORMAL HIGH (ref 39–117)
BUN: 17 mg/dL (ref 6–23)
CO2: 27 mEq/L (ref 19–32)
Calcium: 9.8 mg/dL (ref 8.4–10.5)
Chloride: 104 mEq/L (ref 96–112)
Creatinine, Ser: 0.67 mg/dL (ref 0.40–1.20)
GFR: 90.79 mL/min (ref >60.00)
Glucose, Bld: 104 mg/dL — ABNORMAL HIGH (ref 70–99)
Potassium: 4.2 mEq/L (ref 3.5–5.1)
Sodium: 140 mEq/L (ref 135–145)
Total Bilirubin: 0.6 mg/dL (ref 0.2–1.2)
Total Protein: 7.1 g/dL (ref 6.0–8.3)

## 2018-11-03 LAB — LDL CHOLESTEROL, DIRECT: Direct LDL: 188 mg/dL

## 2018-11-03 LAB — HEMOGLOBIN A1C: Hgb A1c MFr Bld: 6.1 % (ref 4.6–6.5)

## 2018-11-03 LAB — TSH: TSH: 2.25 u[IU]/mL (ref 0.35–4.50)

## 2018-11-04 DIAGNOSIS — Z01818 Encounter for other preprocedural examination: Secondary | ICD-10-CM | POA: Insufficient documentation

## 2018-11-04 NOTE — Progress Notes (Signed)
Subjective:    Patient ID: [name], female    DOB: 1960/12/21, 58 y.o.   MRN: [id number]  HPI She is here for pre-operative clearance at the request of Dr Rolena Infante for 2 level ACDF C5-6 and C6-7 scheduled for TBD.   She injured herself on 8/4 and conservative measures have not helped.    She denies any personal or family history of problems with anesthesia or bleeding/blood clot problems.    She has no concerns.  She is taking all his medication as prescribed.   She is exercising some - walking.  With her daily activities she denies chest pain, except related to her neck,, palpitations, SOB and lightheadedness.        Obesity, elevated sugar: She has been working on weight loss and overall was doing well up until this injury.  She has been eating much more healthy.  She has been exercising and has lost weight.  She has been taking Tylenol-3000-4000 a day for the past week.  Recent blood work showed that her ALT was slightly elevated and she did stop the Tylenol.  She denies any abdominal pain.    Medications and allergies reviewed with patient and updated if appropriate.  Patient Active Problem List   Diagnosis Date Noted  . Preop examination 11/04/2018  . Obesity 04/30/2018  . Allergic rhinitis 03/26/2016  . Family history of colon cancer 02/24/2016  . Hyperglycemia 02/18/2016  . Hyperlipidemia 02/18/2016  . Stress incontinence 02/14/2016  . Migraines 02/18/1993    Current Outpatient Medications on File Prior to Visit  Medication Sig Dispense Refill  . gabapentin (NEURONTIN) 300 MG capsule gabapentin 300 mg capsule  Take 1 capsule 3 times a day by oral route as needed for 30 days.    . ondansetron (ZOFRAN) 4 MG tablet TAKE ONE TABLET BY MOUTH EVERY 8 HOURS AS NEEDED FOR NAUSEA WITH MIGRAINES 18 tablet 0  . SUMAtriptan (IMITREX) 20 MG/ACT nasal spray Place 1 spray (20 mg total) into the nose every 2 (two) hours as needed for migraine or headache. May repeat in 2 hrs  if headache persists 1 Inhaler 5  . diclofenac (VOLTAREN) 75 MG EC tablet diclofenac sodium 75 mg tablet,delayed release    . HYDROcodone-acetaminophen (NORCO/VICODIN) 5-325 MG tablet hydrocodone 5 mg-acetaminophen 325 mg tablet     No current facility-administered medications on file prior to visit.     History reviewed. No pertinent past medical history.  Past Surgical History:  Procedure Laterality Date  . ACCESSORY NAVICULAR EXCISION     right  . JOINT REPLACEMENT     Right hip replacement    Social History   Socioeconomic History  . Marital status: Married    Spouse name: Not on file  . Number of children: Not on file  . Years of education: Not on file  . Highest education level: Not on file  Occupational History  . Not on file  Social Needs  . Financial resource strain: Not on file  . Food insecurity    Worry: Not on file    Inability: Not on file  . Transportation needs    Medical: Not on file    Non-medical: Not on file  Tobacco Use  . Smoking status: Never Smoker  . Smokeless tobacco: Never Used  Substance and Sexual Activity  . Alcohol use: Yes    Comment: 1-2 glasses/week  . Drug use: No  . Sexual activity: Not on file  Lifestyle  . Physical activity  Days per week: Not on file    Minutes per session: Not on file  . Stress: Not on file  Relationships  . Social Herbalist on phone: Not on file    Gets together: Not on file    Attends religious service: Not on file    Active member of club or organization: Not on file    Attends meetings of clubs or organizations: Not on file    Relationship status: Not on file  Other Topics Concern  . Not on file  Social History Narrative  . Not on file    Family History  Problem Relation Age of Onset  . Hypertension Mother   . Cancer Father 77       Colon    Review of Systems  Constitutional: Negative for chills and fever.  HENT: Positive for rhinorrhea.   Eyes: Negative for visual  disturbance.  Respiratory: Negative for cough, shortness of breath and wheezing.   Cardiovascular: Negative for chest pain (msk chest pain from neck), palpitations and leg swelling.  Gastrointestinal: Negative for abdominal pain, blood in stool, constipation, diarrhea and nausea.  Genitourinary: Negative for dysuria and hematuria.  Skin: Negative for rash.  Neurological: Positive for weakness, numbness and headaches (occ migraines). Negative for dizziness and light-headedness.  Psychiatric/Behavioral: Negative for dysphoric mood. The patient is not nervous/anxious.        Objective:   Vitals:   11/05/18 0918  BP: 130/80  Pulse: 95  Temp: 98.1 F (36.7 C)  SpO2: 97%   Filed Weights   11/05/18 0918  Weight: 199 lb 1.3 oz (90.3 kg)   Body mass index is 32.13 kg/m.  BP Readings from Last 3 Encounters:  11/05/18 130/80  04/30/18 128/84  09/19/17 124/78    Wt Readings from Last 3 Encounters:  11/05/18 199 lb 1.3 oz (90.3 kg)  04/30/18 208 lb 12.8 oz (94.7 kg)  09/19/17 208 lb 0.6 oz (94.4 kg)     Physical Exam Constitutional: She appears well-developed and well-nourished. No distress.  HENT:  Head: Normocephalic and atraumatic.  Right Ear: External ear normal. Normal ear canal and TM Left Ear: External ear normal.  Normal ear canal and TM Mouth/Throat: Oropharynx is clear and moist.  Eyes: Conjunctivae and EOM are normal.  Neck: Neck supple. No tracheal deviation present. No thyromegaly present. No carotid bruit  Cardiovascular: Normal rate, regular rhythm and normal heart sounds.  No murmur heard.  No edema. Pulmonary/Chest: Effort normal and breath sounds normal. No respiratory distress. She has no wheezes. She has no rales.  Abdominal: Soft. She exhibits no distension. There is no tenderness.  Lymphadenopathy: She has no cervical adenopathy.  Skin: Skin is warm and dry. She is not diaphoretic.  Psychiatric: She has a normal mood and affect. Her behavior is normal.         Assessment & Plan:     See Problem List for Assessment and Plan of chronic medical problems.

## 2018-11-05 ENCOUNTER — Ambulatory Visit (INDEPENDENT_AMBULATORY_CARE_PROVIDER_SITE_OTHER): Payer: 59 | Admitting: Internal Medicine

## 2018-11-05 ENCOUNTER — Other Ambulatory Visit (INDEPENDENT_AMBULATORY_CARE_PROVIDER_SITE_OTHER): Payer: 59

## 2018-11-05 ENCOUNTER — Encounter: Payer: Self-pay | Admitting: Internal Medicine

## 2018-11-05 ENCOUNTER — Other Ambulatory Visit: Payer: Self-pay

## 2018-11-05 VITALS — BP 130/80 | HR 95 | Temp 98.1°F | Ht 66.0 in | Wt 199.1 lb

## 2018-11-05 DIAGNOSIS — Z6833 Body mass index (BMI) 33.0-33.9, adult: Secondary | ICD-10-CM

## 2018-11-05 DIAGNOSIS — Z01818 Encounter for other preprocedural examination: Secondary | ICD-10-CM

## 2018-11-05 DIAGNOSIS — E6609 Other obesity due to excess calories: Secondary | ICD-10-CM

## 2018-11-05 DIAGNOSIS — E782 Mixed hyperlipidemia: Secondary | ICD-10-CM

## 2018-11-05 DIAGNOSIS — R7401 Elevation of levels of liver transaminase levels: Secondary | ICD-10-CM

## 2018-11-05 DIAGNOSIS — Z23 Encounter for immunization: Secondary | ICD-10-CM

## 2018-11-05 DIAGNOSIS — R740 Nonspecific elevation of levels of transaminase and lactic acid dehydrogenase [LDH]: Secondary | ICD-10-CM

## 2018-11-05 LAB — HEPATIC FUNCTION PANEL
ALT: 72 U/L — ABNORMAL HIGH (ref 0–35)
AST: 25 U/L (ref 0–37)
Albumin: 4.5 g/dL (ref 3.5–5.2)
Alkaline Phosphatase: 128 U/L — ABNORMAL HIGH (ref 39–117)
Bilirubin, Direct: 0.1 mg/dL (ref 0.0–0.3)
Total Bilirubin: 0.7 mg/dL (ref 0.2–1.2)
Total Protein: 7.3 g/dL (ref 6.0–8.3)

## 2018-11-05 NOTE — Assessment & Plan Note (Signed)
She is working on weight loss and has lost weight She is trying to exercise as much as possible and will continue that after recovery from her neck surgery Continue healthy diet Follow-up in 6 months

## 2018-11-05 NOTE — Patient Instructions (Addendum)
We will the send the form to surgery.    Have blood work done today.     Flu immunization administered today.     Follow up in 6 months

## 2018-11-05 NOTE — Assessment & Plan Note (Signed)
Recent ALT elevated-possibly from taking Tylenol consistently for several days for neck pain She has stopped the Tylenol couple of days ago Will check hepatic function today

## 2018-11-05 NOTE — Assessment & Plan Note (Addendum)
Medically stable and low risk for surgery No history of coronary artery disease or lung disease and no symptoms consistent with either Cleared for surgery-note sent of surgery

## 2018-11-05 NOTE — Assessment & Plan Note (Signed)
Recent cholesterol was quite elevated despite exercise, weight loss and diet changes that have been consistent for the past 6 weeks or so Likely genetic in nature Discussed options-since she is going in for surgery we will hold off on starting any medication at this time Recheck lipid panel in 6 months-if still elevated will consider statin

## 2018-11-10 ENCOUNTER — Ambulatory Visit: Payer: Self-pay | Admitting: Orthopedic Surgery

## 2018-11-23 NOTE — Pre-Procedure Instructions (Signed)
[name] Friend[place] [address] [place] [address] Phone: [phone] Fax: [phone]    Your procedure is scheduled on Wed., Oct. 14, 2020 from 11:38AM-3:38PM  Report to [facility] Entrance "A" at 9:35AM  Call this number if you have problems the morning of surgery:  [phone]   Remember:  Do not eat or drink after midnight on Oct. 13th    Take these medicines the morning of surgery with A SIP OF WATER: Gabapentin (NEURONTIN)  If Needed:  Ketotifen (ALAWAY) eye drops  7 days before surgery (11/25/18), stop taking all Aspirin (unless instructed by your doctor) and Other Aspirin containing products, Vitamins, Fish oils, and Herbal medications. Also stop all NSAIDS i.e. Advil, Ibuprofen, Motrin, Aleve, Anaprox, Naproxen, BC, Goody Powders, and all Supplements. Including: Diclofenac (VOLTAREN)  No Tobacco or Alcohol Products for 24 hours prior to your procedure.  Special instructions:  [name]- Preparing For Surgery  Before surgery, you can play an important role. Because skin is not sterile, your skin needs to be as free of germs as possible. You can reduce the number of germs on your skin by washing with CHG (chlorahexidine gluconate) Soap before surgery.  CHG is an antiseptic cleaner which kills germs and bonds with the skin to continue killing germs even after washing.  Please do not use if you have an allergy to CHG or antibacterial soaps. If your skin becomes reddened/irritated stop using the CHG.  Do not shave (including legs and underarms) for at least 48 hours prior to first CHG shower. It is OK to shave your face.  Please follow these instructions carefully.   1. Shower the NIGHT BEFORE SURGERY and the MORNING OF SURGERY with CHG.   2. If you chose to wash your hair, wash your hair first as usual with your normal shampoo.  3. After you shampoo, rinse your hair and body thoroughly to remove the  shampoo.  4. Use CHG as you would any other liquid soap. You can apply CHG directly to the skin and wash gently with a scrungie or a clean washcloth.   5. Apply the CHG Soap to your body ONLY FROM THE NECK DOWN.  Do not use on open wounds or open sores. Avoid contact with your eyes, ears, mouth and genitals (private parts). Wash Face and genitals (private parts)  with your normal soap.  6. Wash thoroughly, paying special attention to the area where your surgery will be performed.  7. Thoroughly rinse your body with warm water from the neck down.  8. DO NOT shower/wash with your normal soap after using and rinsing off the CHG Soap.  9. Pat yourself dry with a CLEAN TOWEL.  10. Wear CLEAN PAJAMAS to bed the night before surgery, wear comfortable clothes the morning of surgery  11. Place CLEAN SHEETS on your bed the night of your first shower and DO NOT SLEEP WITH PETS.   Day of Surgery:             Remember to brush your teeth WITH YOUR REGULAR TOOTHPASTE.  Do not wear jewelry, make-up or nail polish.  Do not wear lotions, powders, or perfumes, or deodorant.  Do not shave 48 hours prior to surgery.    Do not bring valuables to the hospital.  [facility] is not responsible for any belongings or valuables.  Contacts, dentures or bridgework may not be worn into surgery.    For patients admitted  to the hospital, discharge time will be determined by your treatment team.  Patients discharged the day of surgery will not be allowed to drive home, and someone age 57 and over has to be with them for 24 hours.  Please wear clean clothes to the hospital/surgery center.    Please read over the following fact sheets that you were given. Pain Booklet, Coughing and Deep Breathing, MRSA Information and Surgical Site Infection Prevention

## 2018-11-24 ENCOUNTER — Other Ambulatory Visit: Payer: Self-pay

## 2018-11-24 ENCOUNTER — Encounter (HOSPITAL_COMMUNITY): Payer: Self-pay

## 2018-11-24 ENCOUNTER — Ambulatory Visit (HOSPITAL_COMMUNITY)
Admission: RE | Admit: 2018-11-24 | Discharge: 2018-11-24 | Disposition: A | Discharge status: Home / Self Care | Payer: PRIVATE HEALTH INSURANCE | Source: Ambulatory Visit | Attending: Orthopedic Surgery | Admitting: Orthopedic Surgery

## 2018-11-24 ENCOUNTER — Encounter (HOSPITAL_COMMUNITY)
Admission: RE | Admit: 2018-11-24 | Discharge: 2018-11-24 | Disposition: A | Discharge status: Home / Self Care | Payer: PRIVATE HEALTH INSURANCE | Source: Ambulatory Visit | Attending: Orthopedic Surgery | Admitting: Orthopedic Surgery

## 2018-11-24 ENCOUNTER — Ambulatory Visit: Payer: Self-pay | Admitting: Orthopedic Surgery

## 2018-11-24 DIAGNOSIS — Z01812 Encounter for preprocedural laboratory examination: Secondary | ICD-10-CM | POA: Insufficient documentation

## 2018-11-24 DIAGNOSIS — Z01818 Encounter for other preprocedural examination: Secondary | ICD-10-CM

## 2018-11-24 HISTORY — DX: Other specified postprocedural states: R11.2

## 2018-11-24 HISTORY — DX: Other specified postprocedural states: Z98.890

## 2018-11-24 HISTORY — DX: Other complications of anesthesia, initial encounter: T88.59XA

## 2018-11-24 HISTORY — DX: Migraine, unspecified, not intractable, without status migrainosus: G43.909

## 2018-11-24 LAB — URINALYSIS, ROUTINE W REFLEX MICROSCOPIC
Bilirubin Urine: NEGATIVE
Glucose, UA: NEGATIVE mg/dL
Hgb urine dipstick: NEGATIVE
Ketones, ur: NEGATIVE mg/dL
Leukocytes,Ua: NEGATIVE
Nitrite: NEGATIVE
Protein, ur: NEGATIVE mg/dL
Specific Gravity, Urine: 1.005 (ref 1.005–1.030)
pH: 7 (ref 5.0–8.0)

## 2018-11-24 LAB — BASIC METABOLIC PANEL
Anion gap: 9 (ref 5–15)
BUN: 11 mg/dL (ref 6–20)
CO2: 27 mmol/L (ref 22–32)
Calcium: 9.7 mg/dL (ref 8.9–10.3)
Chloride: 101 mmol/L (ref 98–111)
Creatinine, Ser: 0.7 mg/dL (ref 0.44–1.00)
GFR calc Af Amer: >60 mL/min (ref >60)
GFR calc non Af Amer: >60 mL/min (ref >60)
Glucose, Bld: 99 mg/dL (ref 70–99)
Potassium: 3.9 mmol/L (ref 3.5–5.1)
Sodium: 137 mmol/L (ref 135–145)

## 2018-11-24 LAB — CBC
HCT: 42.6 % (ref 36.0–46.0)
Hemoglobin: 14 g/dL (ref 12.0–15.0)
MCH: 29.5 pg (ref 26.0–34.0)
MCHC: 32.9 g/dL (ref 30.0–36.0)
MCV: 89.7 fL (ref 80.0–100.0)
Platelets: 270 10*3/uL (ref 150–400)
RBC: 4.75 MIL/uL (ref 3.87–5.11)
RDW: 13.3 % (ref 11.5–15.5)
WBC: 7.2 10*3/uL (ref 4.0–10.5)
nRBC: 0 % (ref 0.0–0.2)

## 2018-11-24 LAB — PROTIME-INR
INR: 1 (ref 0.8–1.2)
Prothrombin Time: 12.7 seconds (ref 11.4–15.2)

## 2018-11-24 LAB — APTT: aPTT: 34 seconds (ref 24–36)

## 2018-11-24 LAB — SURGICAL PCR SCREEN
MRSA, PCR: NEGATIVE
Staphylococcus aureus: NEGATIVE

## 2018-11-24 NOTE — H&P (Signed)
Subjective:   [name] is a very pleasant 58 year old female with no significant medical history who was in her normal state of health until 09/22/2018 when she was moving books and woke up the following day with excruciating neck pain and radicular right arm pain that she described as aching, burning, throbbing, constant and rates it as a 7 out of 10. Despite conservative measures including narcotic medication, over the counter medications, and an epidural steroid injection she conitnues to have pain that is severe, debilitating and affecting her quality of life. She would like to move forward with surgical intervention. She is scheduled for ACDF C5-7 on 12/02/18 at [facility]. We have obtained medical clearance. Patient has been fitted for ASPEN coller. Patient has had pre-operative visit at [facility]. Patient has discontinued the use of NSAIDs in preperation for the surgery. She is not taking any narcotics. She is taking Gabapentin TID.  Patient Active Problem List   Diagnosis Date Noted  . Elevated alanine aminotransferase (ALT) level 11/05/2018  . Preop examination 11/04/2018  . Obesity 04/30/2018  . Allergic rhinitis 03/26/2016  . Family history of colon cancer 02/24/2016  . Prediabetes 02/18/2016  . Hyperlipidemia 02/18/2016  . Stress incontinence 02/14/2016  . Migraines 02/18/1993   Past Medical History:  Diagnosis Date  . Complication of anesthesia   . Migraine   . PONV (postoperative nausea and vomiting)     Past Surgical History:  Procedure Laterality Date  . ACCESSORY NAVICULAR EXCISION     right  . BILATERAL CARPAL TUNNEL RELEASE    . DILATION AND CURETTAGE OF UTERUS    . FOOT SURGERY     Right  . HIP ARTHROPLASTY     Right  . JOINT REPLACEMENT     Right hip replacement  . TUBAL LIGATION      Current Outpatient Medications  Medication Sig Dispense Refill Last Dose  . diclofenac (VOLTAREN) 75 MG EC tablet Take 75 mg by mouth 2 (two) times daily.     Not Taking  . gabapentin (NEURONTIN) 300 MG capsule Take 300 mg by mouth 3 (three) times daily.    Taking  . ketotifen (ALAWAY) 0.025 % ophthalmic solution Place 1 drop into both eyes 2 (two) times daily as needed (allergies).     . ondansetron (ZOFRAN) 4 MG tablet TAKE ONE TABLET BY MOUTH EVERY 8 HOURS AS NEEDED FOR NAUSEA WITH MIGRAINES (Patient not taking: Reported on 11/20/2018) 18 tablet 0 Not Taking at Unknown time  . SUMAtriptan (IMITREX) 20 MG/ACT nasal spray Place 1 spray (20 mg total) into the nose every 2 (two) hours as needed for migraine or headache. May repeat in 2 hrs if headache persists (Patient not taking: Reported on 11/20/2018) 1 Inhaler 5 Not Taking at Unknown time   No current facility-administered medications for this visit.    No Known Allergies  Social History   Tobacco Use  . Smoking status: Never Smoker  . Smokeless tobacco: Never Used  Substance Use Topics  . Alcohol use: Yes    Comment: 1-2 glasses/week    Family History  Problem Relation Age of Onset  . Hypertension Mother   . Cancer Father 24       Colon    Review of Systems As stated in HPI  Objective:   Vitals: Ht: 5 ft 6 in Wt: 210 lbs BMI: 33.9 BP: 158/89 L wrist Pulse: 93 bpm T: 98.3 F Pain Scale: 7  General: AAOX3, well developed and well nourished, NAD  Ambulation: normal gait pattern, uses no assistive device. Inspection: No obvious deformity, scoleosis, kyphosis, loss of lordotic curve. Palpation: Tender over spinous processes and neck musculature. Heart: RRR, no rubs, murmers, or gallops Lungs: CTAB Abdomen: BSX4. non-tender, non-distended, no hepatosplenomegaly.  AROM: Pain is elicited with isolated shoulder, elbow range of motion, patient is positioned so that her arm is horizontally abducted resting on her chest, and range of motion of her shoulder or elbow causes shooting pain down her arm.  Dermatomes: Patient reports dysesthesias in the right C7 dermatome  pattern.  Myotomes: - shoulder shrug: Left 5/5, Right 5/5 -Shoulder Abduction: Left 5/5, Right 5/5 - Elbow flexion: Left 5/5, Right 3/5 - Elbow extension Left 5/5, Right 3/5 - Wrist Extension Left 5/5, Right 3/5 - Finger abduction: Left 5/5, Right 3/5 - finger Adduction/squeeze: Left 5/5, Right 5/5  Reflexes: - Biceps: Left2+, Right 2+ - Brachioradialius: Left2+, Right 2+ - Triceps: Left 2+, Right 2+ - Hoffman's: Negative - Babinski: Left Ngative, Right Negative - Clonus: Negative  Special Tests: - UE Neural tension test: Left Negative, Right Positive -Rhomberg: Positive - Heel to toe walking Positive  PV: Extremities warm and well profused. Palpable UE pulses bilaterally.   X-Ray impression: X-rays of the cervical spine show aReversal of normal cervical lordosis with apex at C5 and a degenerative grade 1 anterior listhesis of C4 on C5 with degenerative disc disease noted C5-6 and C6-7.  MRI Impression: MRI of the cervical spine dated October 20, 2018 shows a new large right C6-7 disc extrusion with moderate to severe caudal migration resulting in severe effacement of the right subarticular zone and lateral recess impinging upon the right C7 and right C8 nerve roots. There is also moderate to severe left and mild to moderate right C5-6 neural foraminal narrowing impinging upon the exiting left C6 nerve roots. No spinal cord lesions are seen  Assessment:   [name] clinical exam continues to show neurological deficits in the C6 and C7 dermatome on the right side. Imaging studies confirm right lateral recess and foraminal stenosis with a disc herniation at C5-6 and C6-7 causing nerve compression. As a result of the failure to improve with the injection and her neurological deficits I do believe it is warranted to move forward with surgery. The patient states her pain is debilitating and she would like to move forward with surgery.  Plan:   We will move forward with C5-7 ACDF.  I  have gone over the surgical procedure with her in great detail which would be a 2 level ACDF. I have used her MRI and model to demonstrate the surgical procedure. I also gone over all the risks, benefits, and alternatives to surgery.  Risks and benefits of surgery were discussed with the patient. These include: Infection, bleeding, death, stroke, paralysis, ongoing or worse pain, need for additional surgery, nonunion, leak of spinal fluid, adjacent segment degeneration requiring additional fusion surgery. Pseudoarthrosis (nonunion)requiring supplemental posterior fixation. Throat pain, swallowing difficulties, hoarseness or change in voice.  I have advised the patient that she may increase her dose of gabapentin to 300 mg in the morning and midday, with 2 tabs (600 mg) at night to help decrease the neuropathic pain effecting her sleep at night.  We have also discussed the goals of surgery to include: Goals of surgery: Reduction in pain, and improvement in quality of life.  We have also discussed the post-operative recovery period to include: bathing/showering restrictions, wound healing, activity (and driving) restrictions, medications/pain mangement.  We have  also discussed post-operative redflags to include: signs and symptoms of postoperative infection, DVT/PE.  We have obtained medical clearance, pre-operative labs are satisfactory. Patient has been fitted for Aspen collar. Patient has discontinued NSAIDs.  All the patient's questions were invited and answered.  Follow-up: 2 weeks postoperatively.

## 2018-11-24 NOTE — H&P (Deleted)
  The note originally documented on this encounter has been moved the the encounter in which it belongs.  

## 2018-11-24 NOTE — Progress Notes (Signed)
PCP - Dr. Jenness Corner- [place]  Cardiologist - Denies  Chest x-ray - 11/24/2018  EKG - Denies  Stress Test - Denies  ECHO - Denies  Cardiac Cath - Denies  AICD-na PM-na LOOP-na  Sleep Study - Denies CPAP - Denies  LABS- 11/24/2018: CBC, BMP, PT, PTT, UA, PCR 11/28/2018: COVID  ASA- Denies  ERAS- No   Anesthesia- Yes- surgical order  Pt denies having chest pain, sob, or fever at this time. All instructions explained to the pt, with a verbal understanding of the material. Pt agrees to go over the instructions while at home for a better understanding. Pt also instructed to self quarantine after being tested for COVID-19. The opportunity to ask questions was provided.   Coronavirus Screening  Have you experienced the following symptoms:  Cough yes/no: No Fever (>100.92F)  yes/no: No Runny nose yes/no: No Sore throat yes/no: No Difficulty breathing/shortness of breath  yes/no: No  Have you or a family member traveled in the last 14 days and where? yes/no: No   If the patient indicates "YES" to the above questions, their PAT will be rescheduled to limit the exposure to others and, the surgeon will be notified. THE PATIENT WILL NEED TO BE ASYMPTOMATIC FOR 14 DAYS.   If the patient is not experiencing any of these symptoms, the PAT nurse will instruct them to NOT bring anyone with them to their appointment since they may have these symptoms or traveled as well.   Please remind your patients and families that hospital visitation restrictions are in effect and the importance of the restrictions.

## 2018-11-25 NOTE — Anesthesia Preprocedure Evaluation (Addendum)
Anesthesia Evaluation  Patient identified by MRN, date of birth, ID band Patient awake    Reviewed: Allergy & Precautions, H&P , NPO status , Patient's Chart, lab work & pertinent test results  History of Anesthesia Complications (+) PONV  Airway Mallampati: II  TM Distance: >3 FB Neck ROM: Limited    Dental no notable dental hx. (+) Teeth Intact, Dental Advisory Given   Pulmonary neg pulmonary ROS,    Pulmonary exam normal breath sounds clear to auscultation       Cardiovascular negative cardio ROS   Rhythm:Regular Rate:Normal     Neuro/Psych  Headaches, negative psych ROS   GI/Hepatic negative GI ROS, Neg liver ROS,   Endo/Other  negative endocrine ROS  Renal/GU negative Renal ROS  negative genitourinary   Musculoskeletal   Abdominal   Peds  Hematology negative hematology ROS (+)   Anesthesia Other Findings   Reproductive/Obstetrics negative OB ROS                            Anesthesia Physical Anesthesia Plan  ASA: II  Anesthesia Plan: General   Post-op Pain Management:    Induction: Intravenous  PONV Risk Score and Plan: 4 or greater and Ondansetron, Dexamethasone, Midazolam and Scopolamine patch - Pre-op  Airway Management Planned: Oral ETT and Video Laryngoscope Planned  Additional Equipment:   Intra-op Plan:   Post-operative Plan: Extubation in OR  Informed Consent: I have reviewed the patients History and Physical, chart, labs and discussed the procedure including the risks, benefits and alternatives for the proposed anesthesia with the patient or authorized representative who has indicated his/her understanding and acceptance.     Dental advisory given  Plan Discussed with: CRNA  Anesthesia Plan Comments: (PCP Dr. Billey Gosling monitoring recent mildly elevated ALT, improved after stopping tylenol: 97 >> 72. Cleared by Dr. Quay Burow 11/05/18 "Medically stable and low  risk for surgery. No history of coronary artery disease or lung disease and no symptoms consistent with either. Cleared for surgery-note sent of surgery.")      Anesthesia Quick Evaluation

## 2018-11-28 ENCOUNTER — Other Ambulatory Visit (HOSPITAL_COMMUNITY)
Admission: RE | Admit: 2018-11-28 | Discharge: 2018-11-28 | Disposition: A | Discharge status: Home / Self Care | Payer: 59 | Source: Ambulatory Visit | Attending: Orthopedic Surgery | Admitting: Orthopedic Surgery

## 2018-11-28 DIAGNOSIS — Z20828 Contact with and (suspected) exposure to other viral communicable diseases: Secondary | ICD-10-CM | POA: Diagnosis not present

## 2018-11-28 DIAGNOSIS — Z01812 Encounter for preprocedural laboratory examination: Secondary | ICD-10-CM | POA: Diagnosis not present

## 2018-11-29 LAB — NOVEL CORONAVIRUS, NAA (HOSP ORDER, SEND-OUT TO REF LAB; TAT 18-24 HRS): SARS-CoV-2, NAA: NOT DETECTED

## 2018-11-30 ENCOUNTER — Other Ambulatory Visit (HOSPITAL_COMMUNITY): Payer: 59

## 2018-12-02 ENCOUNTER — Encounter (HOSPITAL_COMMUNITY): Payer: Self-pay

## 2018-12-02 ENCOUNTER — Other Ambulatory Visit: Payer: Self-pay

## 2018-12-02 ENCOUNTER — Ambulatory Visit (HOSPITAL_COMMUNITY)
Admission: RE | Disposition: A | Discharge status: Home / Self Care | Payer: Self-pay | Source: Home / Self Care | Attending: Orthopedic Surgery

## 2018-12-02 ENCOUNTER — Ambulatory Visit (HOSPITAL_COMMUNITY): Payer: PRIVATE HEALTH INSURANCE | Attending: Orthopedic Surgery

## 2018-12-02 ENCOUNTER — Ambulatory Visit (HOSPITAL_COMMUNITY): Payer: PRIVATE HEALTH INSURANCE | Admitting: Anesthesiology

## 2018-12-02 ENCOUNTER — Observation Stay (HOSPITAL_COMMUNITY)
Admission: RE | Admit: 2018-12-02 | Discharge: 2018-12-03 | Disposition: A | Discharge status: Home / Self Care | Payer: PRIVATE HEALTH INSURANCE | Attending: Orthopedic Surgery | Admitting: Orthopedic Surgery

## 2018-12-02 ENCOUNTER — Ambulatory Visit (HOSPITAL_COMMUNITY): Payer: PRIVATE HEALTH INSURANCE | Admitting: Physician Assistant

## 2018-12-02 DIAGNOSIS — Z791 Long term (current) use of non-steroidal anti-inflammatories (NSAID): Secondary | ICD-10-CM | POA: Diagnosis not present

## 2018-12-02 DIAGNOSIS — M50122 Cervical disc disorder at C5-C6 level with radiculopathy: Principal | ICD-10-CM | POA: Insufficient documentation

## 2018-12-02 DIAGNOSIS — G43909 Migraine, unspecified, not intractable, without status migrainosus: Secondary | ICD-10-CM | POA: Diagnosis not present

## 2018-12-02 DIAGNOSIS — Z96641 Presence of right artificial hip joint: Secondary | ICD-10-CM | POA: Insufficient documentation

## 2018-12-02 DIAGNOSIS — M5020 Other cervical disc displacement, unspecified cervical region: Secondary | ICD-10-CM | POA: Diagnosis present

## 2018-12-02 DIAGNOSIS — Z419 Encounter for procedure for purposes other than remedying health state, unspecified: Secondary | ICD-10-CM | POA: Insufficient documentation

## 2018-12-02 DIAGNOSIS — M542 Cervicalgia: Secondary | ICD-10-CM | POA: Diagnosis present

## 2018-12-02 HISTORY — PX: ANTERIOR CERVICAL DECOMP/DISCECTOMY FUSION: SHX1161

## 2018-12-02 LAB — GLUCOSE, CAPILLARY: Glucose-Capillary: 164 mg/dL — ABNORMAL HIGH (ref 70–99)

## 2018-12-02 SURGERY — ANTERIOR CERVICAL DECOMPRESSION/DISCECTOMY FUSION 2 LEVELS
Anesthesia: General | Site: Spine Cervical

## 2018-12-02 MED ORDER — ONDANSETRON HCL 4 MG PO TABS: 4.0000 mg | ORAL_TABLET | Freq: Three times a day (TID) | ORAL | 0 refills | Status: DC | PRN

## 2018-12-02 MED ORDER — LACTATED RINGERS IV SOLN
INTRAVENOUS | Status: DC
  Administered 2018-12-02: 20:00:00 via INTRAVENOUS

## 2018-12-02 MED ORDER — METHOCARBAMOL 500 MG PO TABS
500.0000 mg | ORAL_TABLET | Freq: Four times a day (QID) | ORAL | Status: DC | PRN
  Administered 2018-12-03: 500 mg via ORAL
  Filled 2018-12-02: qty 1

## 2018-12-02 MED ORDER — DEXAMETHASONE SODIUM PHOSPHATE 10 MG/ML IJ SOLN
INTRAMUSCULAR | Status: DC | PRN
  Administered 2018-12-02: 10 mg via INTRAVENOUS

## 2018-12-02 MED ORDER — SUGAMMADEX SODIUM 200 MG/2ML IV SOLN
INTRAVENOUS | Status: DC | PRN
  Administered 2018-12-02: 200 mg via INTRAVENOUS

## 2018-12-02 MED ORDER — HYDROMORPHONE HCL 1 MG/ML IJ SOLN
INTRAMUSCULAR | Status: AC
  Filled 2018-12-02: qty 1

## 2018-12-02 MED ORDER — OXYCODONE HCL 5 MG PO TABS
5.0000 mg | ORAL_TABLET | ORAL | Status: DC | PRN
  Administered 2018-12-02: 5 mg via ORAL

## 2018-12-02 MED ORDER — DEXAMETHASONE SODIUM PHOSPHATE 10 MG/ML IJ SOLN
INTRAMUSCULAR | Status: AC
  Filled 2018-12-02: qty 1

## 2018-12-02 MED ORDER — MENTHOL 3 MG MT LOZG: 1.0000 | LOZENGE | OROMUCOSAL | Status: DC | PRN

## 2018-12-02 MED ORDER — ACETAMINOPHEN 325 MG PO TABS
650.0000 mg | ORAL_TABLET | ORAL | Status: DC | PRN
  Administered 2018-12-02: 650 mg via ORAL
  Filled 2018-12-02: qty 2

## 2018-12-02 MED ORDER — GLYCOPYRROLATE PF 0.2 MG/ML IJ SOSY
PREFILLED_SYRINGE | INTRAMUSCULAR | Status: DC | PRN
  Administered 2018-12-02: .2 mg via INTRAVENOUS

## 2018-12-02 MED ORDER — HYDROMORPHONE HCL 1 MG/ML IJ SOLN
0.2500 mg | INTRAMUSCULAR | Status: DC | PRN
  Administered 2018-12-02: 0.5 mg via INTRAVENOUS

## 2018-12-02 MED ORDER — ACETAMINOPHEN 10 MG/ML IV SOLN
INTRAVENOUS | Status: AC
  Filled 2018-12-02: qty 100

## 2018-12-02 MED ORDER — LABETALOL HCL 5 MG/ML IV SOLN
INTRAVENOUS | Status: AC
  Filled 2018-12-02: qty 4

## 2018-12-02 MED ORDER — METOPROLOL TARTRATE 5 MG/5ML IV SOLN
INTRAVENOUS | Status: AC
  Filled 2018-12-02: qty 5

## 2018-12-02 MED ORDER — PROPOFOL 10 MG/ML IV BOLUS
INTRAVENOUS | Status: DC | PRN
  Administered 2018-12-02: 150 mg via INTRAVENOUS

## 2018-12-02 MED ORDER — METHOCARBAMOL 1000 MG/10ML IJ SOLN
500.0000 mg | Freq: Four times a day (QID) | INTRAVENOUS | Status: DC | PRN
  Filled 2018-12-02: qty 5

## 2018-12-02 MED ORDER — PHENOL 1.4 % MT LIQD
1.0000 | OROMUCOSAL | Status: DC | PRN
  Filled 2018-12-02: qty 177

## 2018-12-02 MED ORDER — PHENYLEPHRINE 40 MCG/ML (10ML) SYRINGE FOR IV PUSH (FOR BLOOD PRESSURE SUPPORT)
PREFILLED_SYRINGE | INTRAVENOUS | Status: DC | PRN
  Administered 2018-12-02: 80 ug via INTRAVENOUS

## 2018-12-02 MED ORDER — MORPHINE SULFATE (PF) 2 MG/ML IV SOLN
2.0000 mg | INTRAVENOUS | Status: DC | PRN
  Administered 2018-12-02: 2 mg via INTRAVENOUS
  Filled 2018-12-02: qty 1

## 2018-12-02 MED ORDER — ACETAMINOPHEN 650 MG RE SUPP: 650.0000 mg | RECTAL | Status: DC | PRN

## 2018-12-02 MED ORDER — LIDOCAINE 2% (20 MG/ML) 5 ML SYRINGE
INTRAMUSCULAR | Status: DC | PRN
  Administered 2018-12-02: 20 mg via INTRAVENOUS
  Administered 2018-12-02: 60 mg via INTRAVENOUS

## 2018-12-02 MED ORDER — FENTANYL CITRATE (PF) 100 MCG/2ML IJ SOLN
INTRAMUSCULAR | Status: DC | PRN
  Administered 2018-12-02 (×5): 50 ug via INTRAVENOUS
  Administered 2018-12-02: 100 ug via INTRAVENOUS

## 2018-12-02 MED ORDER — ONDANSETRON HCL 4 MG PO TABS: 4.0000 mg | ORAL_TABLET | Freq: Four times a day (QID) | ORAL | Status: DC | PRN

## 2018-12-02 MED ORDER — LIDOCAINE 2% (20 MG/ML) 5 ML SYRINGE
INTRAMUSCULAR | Status: AC
  Filled 2018-12-02: qty 5

## 2018-12-02 MED ORDER — SUCCINYLCHOLINE CHLORIDE 200 MG/10ML IV SOSY
PREFILLED_SYRINGE | INTRAVENOUS | Status: DC | PRN
  Administered 2018-12-02: 100 mg via INTRAVENOUS

## 2018-12-02 MED ORDER — CEFAZOLIN SODIUM-DEXTROSE 1-4 GM/50ML-% IV SOLN
1.0000 g | Freq: Three times a day (TID) | INTRAVENOUS | Status: AC
  Administered 2018-12-02 – 2018-12-03 (×2): 1 g via INTRAVENOUS
  Filled 2018-12-02 (×2): qty 50

## 2018-12-02 MED ORDER — FENTANYL CITRATE (PF) 250 MCG/5ML IJ SOLN
INTRAMUSCULAR | Status: AC
  Filled 2018-12-02: qty 5

## 2018-12-02 MED ORDER — CEFAZOLIN SODIUM-DEXTROSE 2-4 GM/100ML-% IV SOLN
2.0000 g | INTRAVENOUS | Status: AC
  Administered 2018-12-02: 2 g via INTRAVENOUS

## 2018-12-02 MED ORDER — CEFAZOLIN SODIUM-DEXTROSE 2-4 GM/100ML-% IV SOLN
INTRAVENOUS | Status: AC
  Filled 2018-12-02: qty 100

## 2018-12-02 MED ORDER — ONDANSETRON HCL 4 MG/2ML IJ SOLN
INTRAMUSCULAR | Status: AC
  Filled 2018-12-02: qty 2

## 2018-12-02 MED ORDER — 0.9 % SODIUM CHLORIDE (POUR BTL) OPTIME
TOPICAL | Status: DC | PRN
  Administered 2018-12-02 (×2): 1000 mL

## 2018-12-02 MED ORDER — HEMOSTATIC AGENTS (NO CHARGE) OPTIME
TOPICAL | Status: DC | PRN
  Administered 2018-12-02: 1

## 2018-12-02 MED ORDER — GABAPENTIN 300 MG PO CAPS
300.0000 mg | ORAL_CAPSULE | Freq: Three times a day (TID) | ORAL | Status: DC
  Administered 2018-12-02 – 2018-12-03 (×2): 300 mg via ORAL
  Filled 2018-12-02 (×2): qty 1

## 2018-12-02 MED ORDER — ACETAMINOPHEN 10 MG/ML IV SOLN
1000.0000 mg | Freq: Once | INTRAVENOUS | Status: AC
  Administered 2018-12-02: 16:00:00 1000 mg via INTRAVENOUS
  Filled 2018-12-02: qty 100

## 2018-12-02 MED ORDER — THROMBIN 20000 UNITS EX SOLR
CUTANEOUS | Status: DC | PRN
  Administered 2018-12-02: 13:00:00

## 2018-12-02 MED ORDER — SCOPOLAMINE 1 MG/3DAYS TD PT72
1.0000 | MEDICATED_PATCH | TRANSDERMAL | Status: DC
  Administered 2018-12-02: 1.5 mg via TRANSDERMAL

## 2018-12-02 MED ORDER — MIDAZOLAM HCL 2 MG/2ML IJ SOLN
INTRAMUSCULAR | Status: AC
  Filled 2018-12-02: qty 2

## 2018-12-02 MED ORDER — OXYCODONE-ACETAMINOPHEN 10-325 MG PO TABS: 1.0000 | ORAL_TABLET | Freq: Four times a day (QID) | ORAL | 0 refills | Status: AC | PRN

## 2018-12-02 MED ORDER — PROPOFOL 10 MG/ML IV BOLUS
INTRAVENOUS | Status: AC
  Filled 2018-12-02: qty 20

## 2018-12-02 MED ORDER — SODIUM CHLORIDE 0.9 % IV SOLN
INTRAVENOUS | Status: DC | PRN
  Administered 2018-12-02: 25 ug/min via INTRAVENOUS

## 2018-12-02 MED ORDER — MIDAZOLAM HCL 5 MG/5ML IJ SOLN
INTRAMUSCULAR | Status: DC | PRN
  Administered 2018-12-02: 2 mg via INTRAVENOUS

## 2018-12-02 MED ORDER — SODIUM CHLORIDE 0.9% FLUSH: 3.0000 mL | INTRAVENOUS | Status: DC | PRN

## 2018-12-02 MED ORDER — LACTATED RINGERS IV SOLN
INTRAVENOUS | Status: DC
  Administered 2018-12-02 (×2): via INTRAVENOUS

## 2018-12-02 MED ORDER — BUPIVACAINE-EPINEPHRINE (PF) 0.25% -1:200000 IJ SOLN
INTRAMUSCULAR | Status: AC
  Filled 2018-12-02: qty 20

## 2018-12-02 MED ORDER — SODIUM CHLORIDE 0.9 % IV SOLN: 250.0000 mL | INTRAVENOUS | Status: DC

## 2018-12-02 MED ORDER — LABETALOL HCL 5 MG/ML IV SOLN
INTRAVENOUS | Status: DC | PRN
  Administered 2018-12-02: 5 mg via INTRAVENOUS

## 2018-12-02 MED ORDER — METHOCARBAMOL 500 MG PO TABS: 500.0000 mg | ORAL_TABLET | Freq: Three times a day (TID) | ORAL | 0 refills | Status: AC | PRN

## 2018-12-02 MED ORDER — SODIUM CHLORIDE 0.9% FLUSH
3.0000 mL | Freq: Two times a day (BID) | INTRAVENOUS | Status: DC
  Administered 2018-12-02: 3 mL via INTRAVENOUS

## 2018-12-02 MED ORDER — SCOPOLAMINE 1 MG/3DAYS TD PT72
MEDICATED_PATCH | TRANSDERMAL | Status: AC
  Filled 2018-12-02: qty 1

## 2018-12-02 MED ORDER — THROMBIN (RECOMBINANT) 20000 UNITS EX SOLR
CUTANEOUS | Status: AC
  Filled 2018-12-02: qty 20000

## 2018-12-02 MED ORDER — BUPIVACAINE-EPINEPHRINE 0.25% -1:200000 IJ SOLN
INTRAMUSCULAR | Status: DC | PRN
  Administered 2018-12-02: 6 mL

## 2018-12-02 MED ORDER — ONDANSETRON HCL 4 MG/2ML IJ SOLN
INTRAMUSCULAR | Status: DC | PRN
  Administered 2018-12-02: 4 mg via INTRAVENOUS

## 2018-12-02 MED ORDER — ROCURONIUM BROMIDE 10 MG/ML (PF) SYRINGE
PREFILLED_SYRINGE | INTRAVENOUS | Status: DC | PRN
  Administered 2018-12-02: 20 mg via INTRAVENOUS
  Administered 2018-12-02: 50 mg via INTRAVENOUS

## 2018-12-02 MED ORDER — OXYCODONE HCL 5 MG PO TABS
10.0000 mg | ORAL_TABLET | ORAL | Status: DC | PRN
  Administered 2018-12-03 (×2): 10 mg via ORAL
  Filled 2018-12-02 (×3): qty 2

## 2018-12-02 MED ORDER — ONDANSETRON HCL 4 MG/2ML IJ SOLN
4.0000 mg | Freq: Four times a day (QID) | INTRAMUSCULAR | Status: DC | PRN
  Administered 2018-12-03: 4 mg via INTRAVENOUS
  Filled 2018-12-02: qty 2

## 2018-12-02 SURGICAL SUPPLY — 75 items
BIT DRILL NEURO 2X3.1 SFT TUCH (MISCELLANEOUS) IMPLANT
BIT DRILL SKYLINE 12MM (BIT) ×1 IMPLANT
BLADE CLIPPER SURG (BLADE) IMPLANT
BONE VIVIGEN FORMABLE 1.3CC (Bone Implant) ×2 IMPLANT
BUR EGG ELITE 4.0 (BURR) IMPLANT
BUR MATCHSTICK NEURO 3.0 LAGG (BURR) IMPLANT
CABLE BIPOLOR RESECTION CORD (MISCELLANEOUS) ×2 IMPLANT
CANISTER SUCT 3000ML PPV (MISCELLANEOUS) ×2 IMPLANT
CLSR STERI-STRIP ANTIMIC 1/2X4 (GAUZE/BANDAGES/DRESSINGS) ×2 IMPLANT
COVER MAYO STAND STRL (DRAPES) ×6 IMPLANT
COVER SURGICAL LIGHT HANDLE (MISCELLANEOUS) ×4 IMPLANT
COVER WAND RF STERILE (DRAPES) ×2 IMPLANT
DEVICE EDNSKLTN TC NNLCK MED 8 (Cage) ×1 IMPLANT
DEVICE ENDSKLTN IMPL 16X14X7X6 (Cage) ×1 IMPLANT
DRAPE C-ARM 42X72 X-RAY (DRAPES) ×2 IMPLANT
DRAPE MICROSCOPE LEICA 46X105 (MISCELLANEOUS) IMPLANT
DRAPE POUCH INSTRU U-SHP 10X18 (DRAPES) ×2 IMPLANT
DRAPE SURG 17X23 STRL (DRAPES) ×2 IMPLANT
DRAPE U-SHAPE 47X51 STRL (DRAPES) ×2 IMPLANT
DRILL BIT SKYLINE 12MM (BIT) ×1
DRILL NEURO 2X3.1 SOFT TOUCH (MISCELLANEOUS)
DRSG OPSITE POSTOP 3X4 (GAUZE/BANDAGES/DRESSINGS) ×2 IMPLANT
DRSG OPSITE POSTOP 4X6 (GAUZE/BANDAGES/DRESSINGS) ×2 IMPLANT
DURAPREP 26ML APPLICATOR (WOUND CARE) ×2 IMPLANT
ELECT COATED BLADE 2.86 ST (ELECTRODE) ×2 IMPLANT
ELECT PENCIL ROCKER SW 15FT (MISCELLANEOUS) ×2 IMPLANT
ELECT REM PT RETURN 9FT ADLT (ELECTROSURGICAL) ×2
ELECTRODE REM PT RTRN 9FT ADLT (ELECTROSURGICAL) ×1 IMPLANT
ENDOSKELETON IMPLANT 16X14X7X6 (Cage) ×2 IMPLANT
ENDOSKELTON IMPLANT TC MED 8MM (Cage) ×2 IMPLANT
GLOVE BIO SURGEON STRL SZ 6.5 (GLOVE) ×2 IMPLANT
GLOVE BIOGEL PI IND STRL 6.5 (GLOVE) ×1 IMPLANT
GLOVE BIOGEL PI IND STRL 8.5 (GLOVE) ×1 IMPLANT
GLOVE BIOGEL PI INDICATOR 6.5 (GLOVE) ×1
GLOVE BIOGEL PI INDICATOR 8.5 (GLOVE) ×1
GLOVE SS BIOGEL STRL SZ 8.5 (GLOVE) ×1 IMPLANT
GLOVE SUPERSENSE BIOGEL SZ 8.5 (GLOVE) ×1
GOWN STRL REUS W/ TWL LRG LVL3 (GOWN DISPOSABLE) ×1 IMPLANT
GOWN STRL REUS W/TWL 2XL LVL3 (GOWN DISPOSABLE) ×2 IMPLANT
GOWN STRL REUS W/TWL LRG LVL3 (GOWN DISPOSABLE) ×1
KIT BASIN OR (CUSTOM PROCEDURE TRAY) ×2 IMPLANT
KIT TURNOVER KIT B (KITS) ×2 IMPLANT
NEEDLE HYPO 22GX1.5 SAFETY (NEEDLE) ×2 IMPLANT
NEEDLE SPNL 18GX3.5 QUINCKE PK (NEEDLE) ×2 IMPLANT
NS IRRIG 1000ML POUR BTL (IV SOLUTION) ×2 IMPLANT
PACK ORTHO CERVICAL (CUSTOM PROCEDURE TRAY) ×2 IMPLANT
PACK UNIVERSAL I (CUSTOM PROCEDURE TRAY) ×2 IMPLANT
PAD ARMBOARD 7.5X6 YLW CONV (MISCELLANEOUS) ×4 IMPLANT
PATTIES SURGICAL .25X.25 (GAUZE/BANDAGES/DRESSINGS) ×2 IMPLANT
PIN DISTRACTION 14 (PIN) ×4 IMPLANT
PLATE SKYLINE TWO LEVEL 32MM (Plate) ×2 IMPLANT
POSITIONER HEAD DONUT 9IN (MISCELLANEOUS) ×2 IMPLANT
PUTTY DBX 1CC (Putty) ×2 IMPLANT
PUTTY DBX 1CC DEPUY (Putty) ×1 IMPLANT
RESTRAINT LIMB HOLDER UNIV (RESTRAINTS) ×2 IMPLANT
RUBBERBAND STERILE (MISCELLANEOUS) IMPLANT
SCREW SKYLINE VARIABLE LG (Screw) ×6 IMPLANT
SCREW VARIABLE SELF TAP 12MM (Screw) ×6 IMPLANT
SPONGE INTESTINAL PEANUT (DISPOSABLE) ×4 IMPLANT
SPONGE LAP 4X18 RFD (DISPOSABLE) ×4 IMPLANT
SPONGE SURGIFOAM ABS GEL 100 (HEMOSTASIS) ×2 IMPLANT
SURGIFLO W/THROMBIN 8M KIT (HEMOSTASIS) IMPLANT
SUT BONE WAX W31G (SUTURE) ×2 IMPLANT
SUT MON AB 3-0 SH 27 (SUTURE) ×1
SUT MON AB 3-0 SH27 (SUTURE) ×1 IMPLANT
SUT SILK 2 0 (SUTURE)
SUT SILK 2-0 18XBRD TIE 12 (SUTURE) IMPLANT
SUT VIC AB 2-0 CT1 18 (SUTURE) ×2 IMPLANT
SYR BULB IRRIGATION 50ML (SYRINGE) ×2 IMPLANT
SYR CONTROL 10ML LL (SYRINGE) ×2 IMPLANT
TAPE CLOTH 4X10 WHT NS (GAUZE/BANDAGES/DRESSINGS) ×2 IMPLANT
TAPE UMBILICAL COTTON 1/8X30 (MISCELLANEOUS) ×2 IMPLANT
TOWEL GREEN STERILE (TOWEL DISPOSABLE) ×2 IMPLANT
TOWEL GREEN STERILE FF (TOWEL DISPOSABLE) ×2 IMPLANT
WATER STERILE IRR 1000ML POUR (IV SOLUTION) ×2 IMPLANT

## 2018-12-02 NOTE — H&P (Signed)
Addendum H&P  Patient presents today because of ongoing significant cervical and radicular right arm pain.  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  Patient's clinical exam is essentially unchanged from her last office visit of 11/24/2018.  She continues to have significant neck and radicular arm pain along with weakness of the wrist extensor and triceps.  MRI confirms a large right C5-6 disc herniation with compression of the exiting C6 nerve root as well as C6-7 lateral recess stenosis and hard disc osteophyte causing compression of the C7 nerve root.  Surgical plan is to level ACDF C5-7.  I have gone over the surgical procedure along with all appropriate risks benefits and alternatives to surgery.  All of her questions were encouraged and addressed.  Patient has expressed a desire to move forward with the planned surgical procedure.

## 2018-12-02 NOTE — Anesthesia Procedure Notes (Signed)
Procedure Name: Intubation Date/Time: 12/02/2018 12:34 PM Performed by: Jenne Campus, CRNA Pre-anesthesia Checklist: Patient identified, Emergency Drugs available, Suction available and Patient being monitored Patient Re-evaluated:Patient Re-evaluated prior to induction Oxygen Delivery Method: Circle System Utilized Preoxygenation: Pre-oxygenation with 100% oxygen Induction Type: IV induction Ventilation: Mask ventilation without difficulty Laryngoscope Size: Glidescope and 3 Grade View: Grade I Tube type: Oral Tube size: 7.0 mm Number of attempts: 1 Airway Equipment and Method: Stylet,  Oral airway and Video-laryngoscopy Placement Confirmation: ETT inserted through vocal cords under direct vision,  positive ETCO2 and breath sounds checked- equal and bilateral Secured at: 21 cm Tube secured with: Tape Dental Injury: Teeth and Oropharynx as per pre-operative assessment  Comments: Smooth IV induction. Easy mask. Atraumatic intubation. Elective video-glide. Head/neck remained in neutral position during induction and intubation.

## 2018-12-02 NOTE — Transfer of Care (Signed)
Immediate Anesthesia Transfer of Care Note  Patient: [name]  Procedure(s) Performed: ANTERIOR CERVICAL DECOMPRESSION/DISCECTOMY FUSION CERVICAL FIVE THROUGH CERVICAL SEVEN (N/A Spine Cervical)  Patient Location: PACU  Anesthesia Type:General  Level of Consciousness: oriented, drowsy and patient cooperative  Airway & Oxygen Therapy: Patient Spontanous Breathing and Patient connected to nasal cannula oxygen  Post-op Assessment: Report given to RN and Post -op Vital signs reviewed and stable  Post vital signs: Reviewed  Last Vitals:  Vitals Value Taken Time  BP 144/85 12/02/18 1642  Temp    Pulse 101 12/02/18 1645  Resp 14 12/02/18 1645  SpO2 98 % 12/02/18 1645  Vitals shown include unvalidated device data.  Last Pain:  Vitals:   12/02/18 0944  PainSc: 3          Complications: No apparent anesthesia complications

## 2018-12-02 NOTE — Anesthesia Postprocedure Evaluation (Signed)
Anesthesia Post Note  Patient: CARABELLA WADDELL  Procedure(s) Performed: ANTERIOR CERVICAL DECOMPRESSION/DISCECTOMY FUSION CERVICAL FIVE THROUGH CERVICAL SEVEN (N/A Spine Cervical)     Patient location during evaluation: PACU Anesthesia Type: General Level of consciousness: awake and alert Pain management: pain level controlled Vital Signs Assessment: post-procedure vital signs reviewed and stable Respiratory status: spontaneous breathing, nonlabored ventilation and respiratory function stable Cardiovascular status: blood pressure returned to baseline and stable Postop Assessment: no apparent nausea or vomiting Anesthetic complications: no    Last Vitals:  Vitals:   12/02/18 1700 12/02/18 1715  BP: (!) 143/79 (!) 143/83  Pulse: (!) 103 (!) 107  Resp: 10 (!) 23  Temp:    SpO2: 98% 98%    Last Pain:  Vitals:   12/02/18 1645  PainSc: Asleep                 Shelsey Rieth,W. EDMOND

## 2018-12-02 NOTE — Op Note (Signed)
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  Postoperative diagnosis: Same  Operative procedure: ACDF [id number]  Complications: None  Implants: Medtronic/Titan Nano lock intervertebral spacers: C6-7: 8 mm medium lordotic spacer.  C5-6: 7 mm medium lordotic spacer  Depew anterior cervical skyline plate.  32 mm length.  Affixed with 12 mm locking screws.  Allograft: vivogen and DBX  First Assistant: Cleta Alberts, PA  Indications: [name] is a very pleasant 58 year old woman who presented my office with complaints of significant neck and radicular right arm pain.  Clinical exam demonstrated both motor and sensory deficits in the right upper extremity which correlated with the imaging studies.  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  After discussing treatment options he elected to move forward with surgery.  All appropriate risks benefits and alternatives were discussed with the patient and consent was obtained.  Operative report  Patient was brought the operating room placed upon the operating room table.  After successful induction of general anesthesia and endotracheal ovation teds SCDs were applied and the anterior cervical spine was prepped and draped in a standard fashion.  Timeout was taken to confirm patient procedure and all other important data.  Lateral fluoroscopy images were obtained to identify the C6 vertebral body.  I marked this out and infiltrated the incision site with quarter percent Marcaine with epinephrine.  Transverse incision was made starting in the midline and proceeding to the left and then sharp dissection was carried out through the adipose tissue down to the platysma.  The platysma was isolated and incised.  This was a standard Smith-Robinson approach to the anterior cervical spine.  I began  sharply dissecting along the medial border the sternocleidomastoid proceeding into the deep cervical fascia.  Identified and sacrificed the omohyoid for better visualization.  I continued to sharply dissect down until was able to palpate the carotid sheath.  I then swept the esophagus and trachea to the right and protected the carotid sheath with a finger.  A hand-held retractor was used to protect the esophagus and then using Kitner dissectors I mobilized the remaining prevertebral fascia to expose the anterior longitudinal ligament.  I then placed the needle into the C5-6 disc space and took an intraoperative lateral fluoroscopy x-ray.  Once I confirmed I was at the appropriate level I marked the disc space and continue with the approach.  Using bipolar cautery I mobilized the longus coli muscle from the C4-5 disc space level down to the C7-T1 disc space level.  Once I had adequate mobilization longus coli muscle I placed a self-retaining Caspar retractors underneath the longus coli muscle deflated the endotracheal cuff expanded the retractor and then reinflated the cuff.  With the C6-7 disc space properly visualized I performed an annulotomy with a 15 blade scalpel and then used pituitary rongeurs to remove the bulk of the disc material.  The 2 mm Kerrison Roger was used to remove the overhanging osteophyte from the inferior aspect of the C6 vertebral body distraction pins were then placed into the C6 and C7 vertebral body, and the intervertebral space was distracted and maintained the distraction pin set.  I continue using curettes to remove all of the disc material.  Once I was down to the posterior lip of the vertebral body I used a 1 mm Kerrison to remove the posterior osteophyte.  The large right posterior lateral  disc herniation was well visualized.  I utilized my nerve hook to mobilize the disc fragment and remove 3 large fragments of disc material consistent with what was seen on the preoperative MRI.   Once this was out I was able to develop a plane underneath the remaining portion of the posterior longitudinal ligament and then I resected this in its entirety.  Once I had the PLL resected I could now easily pass my nerve hook underneath the vertebral body of C6 and C7.  I then undercut the uncovertebral joint bilaterally to ensure an adequate indirect foraminotomy.  At this point with the discectomy complete and the decompression was adequate my trial intervertebral spacers and elected use a size 8 medium spacer.  This provided the best intervertebral space and maintain the distraction.  The graft was obtained and packed with the allograft and then malleted to the appropriate depth.  Prior to doing this I did irrigate the wound copiously normal saline and make sure that hemostasis using bipolar cautery.  Once the cage was properly positioned I then positioned my retractors and distraction pin to expose the C5-6 level.  With the C5-6 disc space level now exposed I performed an annulotomy with a 15 blade scalpel and using the same technique I just use of C6-7 I performed a discectomy at this level.  At this point once I was down to the posterior annulus I used my fine nerve hook to remove the hard disc osteophyte.  I also used a 1 mm Kerrison to aid in the uncovertebral joint decompression and removal of the posterior osteophyte ridge.  I was able to ultimately dissect with my nerve hook through the posterior annulus and posterior longitudinal ligament and developed a plane beneath the PLL.  I utilized a 1 mm Kerrison Roger to resect the PLL.  This allowed me to  remove the posterior osteophyte from the the C5 vertebral body complete the central decompression.  I was then able to undercut the osteophyte from the uncovertebral joint.  At this point with the decompression complete I trialed the intervertebral spacer with a rasp trials and elected use the size 7 medium lordotic spacer.  The cage was obtained packed  with the allograft and malleted to the appropriate depth.  At this point both cages were well seated and properly fixed.  I then remove the distraction pins and made sure hemostasis using the bone wax and the residual distraction pin sites.  I then irrigated copiously and confirmed hemostasis using bipolar electrocautery.  I then contoured an anterior cervical plate and affixed it to the C5 and C7 vertebral body with 12 mm locking screws.  All 4 screws had excellent purchase.  I then placed into 12 mm screws into the body is C6 also obtaining excellent purchase.  With all 6 screws properly positione I took final AP and lateral x-rays.  Demonstrate satisfactory position of the intervertebral cages in the anterior cervical plate and all 6 screws.  With satisfactory x-rays I then used the final locking device to secure the screws to the plate.  At this point I irrigated the wound copiously with normal saline again and then placed FloSeal to obtain hemostasis.  I then reirrigated to remove the excess FloSeal and returned the trachea and esophagus to midline.  I did confirm that the esophagus did not become entrapped beneath the plate by direct visualization.  The platysma was then reapproximated with interrupted 2-0 Vicryl suture and the skin was closed with 3-0  Monocryl.  Steri-Strips and a dry dressing were applied as well as an Designer, multimedia.  The patient was ultimately extubated transfer the PACU without incident.  The end of the case all needle sponge counts were correct.  There were no adverse intraoperative events.

## 2018-12-02 NOTE — Discharge Instructions (Signed)

## 2018-12-02 NOTE — Brief Op Note (Signed)
12/02/2018  4:33 PM  PATIENT:  Deri Fuelling  58 y.o. 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  PROCEDURE:  Procedure(s) with comments: ANTERIOR CERVICAL DECOMPRESSION/DISCECTOMY FUSION CERVICAL FIVE THROUGH CERVICAL SEVEN (N/A) - 4 hrs  SURGEON:  Surgeon(s) and Role:    Melina Schools, MD - Primary  PHYSICIAN ASSISTANT:   ASSISTANTS: Amanda Ward   ANESTHESIA:   general  EBL:  25 mL   BLOOD ADMINISTERED:none  DRAINS: none   LOCAL MEDICATIONS USED:  MARCAINE     SPECIMEN:  No Specimen  DISPOSITION OF SPECIMEN:  N/A  COUNTS:  YES  TOURNIQUET:  * No tourniquets in log *  DICTATION: .Dragon Dictation  PLAN OF CARE: Admit for overnight observation  PATIENT DISPOSITION:  PACU - hemodynamically stable.

## 2018-12-03 DIAGNOSIS — M50122 Cervical disc disorder at C5-C6 level with radiculopathy: Secondary | ICD-10-CM | POA: Diagnosis not present

## 2018-12-03 LAB — GLUCOSE, CAPILLARY: Glucose-Capillary: 134 mg/dL — ABNORMAL HIGH (ref 70–99)

## 2018-12-03 MED ORDER — WHITE PETROLATUM EX OINT
TOPICAL_OINTMENT | CUTANEOUS | Status: AC
  Administered 2018-12-03: 02:00:00
  Filled 2018-12-03: qty 28.35

## 2018-12-03 MED FILL — Thrombin (Recombinant) For Soln 20000 Unit: CUTANEOUS | Qty: 1 | Status: AC

## 2018-12-03 NOTE — Evaluation (Signed)
Occupational Therapy Evaluation and Discharge Patient Details Name: [name] MRN: [id number] DOB: 11-May-1960 Today's Date: 12/03/2018    History of Present Illness Pt is a 58 year old woman admitted for C5-7 ACDF. PMH: migraine, R THA, R foot fx, B carpal tunnel repair.   Clinical Impression   Pt educated in cervical precautions related to ADL and IADL, reinforced with written handout. Pt verbalized and/or demonstrated understanding. No further OT needs.    Follow Up Recommendations  No OT follow up    Equipment Recommendations  None recommended by OT    Recommendations for Other Services       Precautions / Restrictions Precautions Precautions: Cervical Precaution Booklet Issued: Yes (comment) Precaution Comments: reviewed cervical precautions with patient Required Braces or Orthoses: Cervical Brace Cervical Brace: Hard collar      Mobility Bed Mobility Overal bed mobility: Modified Independent             General bed mobility comments: educated in log roll technique  Transfers Overall transfer level: Independent                    Balance                                           ADL either performed or assessed with clinical judgement   ADL Overall ADL's : Modified independent                                       General ADL Comments: Educated in compensatory strategies for ADL and activities to avoid.     Vision Baseline Vision/History: Wears glasses Wears Glasses: At all times Patient Visual Report: No change from baseline       Perception     Praxis      Pertinent Vitals/Pain Pain Assessment: Faces Faces Pain Scale: Hurts a little bit Pain Location: incision Pain Descriptors / Indicators: Sore Pain Intervention(s): Monitored during session;Premedicated before session;Repositioned     Hand Dominance Right   Extremity/Trunk Assessment Upper Extremity Assessment Upper Extremity  Assessment: RUE deficits/detail RUE Deficits / Details: edematous, RN made aware, IV intact RUE Coordination: decreased fine motor   Lower Extremity Assessment Lower Extremity Assessment: Defer to PT evaluation   Cervical / Trunk Assessment Cervical / Trunk Assessment: Other exceptions Cervical / Trunk Exceptions: s/p ACDF   Communication Communication Communication: No difficulties   Cognition Arousal/Alertness: Awake/alert Behavior During Therapy: WFL for tasks assessed/performed Overall Cognitive Status: Within Functional Limits for tasks assessed                                     General Comments       Exercises     Shoulder Instructions      Home Living Family/patient expects to be discharged to:: Private residence Living Arrangements: Spouse/significant other;Children Available Help at Discharge: Family;Available 24 hours/day Type of Home: House Home Access: Stairs to enter [employer] of Steps: 4   Home Layout: Multi-level     Bathroom Shower/Tub: Occupational psychologist: Standard                Prior Functioning/Environment Level of Independence: Independent  OT Problem List:        OT Treatment/Interventions:      OT Goals(Current goals can be found in the care plan section) Acute Rehab OT Goals Patient Stated Goal: heal   OT Frequency:     Barriers to D/C:            Co-evaluation              AM-PAC OT "6 Clicks" Daily Activity     Outcome Measure Help from another person eating meals?: None Help from another person taking care of personal grooming?: None Help from another person toileting, which includes using toliet, bedpan, or urinal?: None Help from another person bathing (including washing, rinsing, drying)?: None Help from another person to put on and taking off regular upper body clothing?: None Help from another person to put on and taking off regular lower body  clothing?: None 6 Click Score: 24   End of Session Equipment Utilized During Treatment: Cervical collar Nurse Communication: Other (comment)(edema in R hand)  Activity Tolerance: Patient tolerated treatment well Patient left: Other (comment)(walking with PT)  OT Visit Diagnosis: Pain                Time: [id number] OT Time Calculation (min): 25 min Charges:  OT General Charges $OT Visit: 1 Visit OT Evaluation $OT Eval Low Complexity: 1 Low OT Treatments $Self Care/Home Management : 8-22 mins  Nestor Lewandowsky, OTR/L Acute Rehabilitation Services Pager: [phone] Office: [phone]  [name] 12/03/2018, 9:16 AM

## 2018-12-03 NOTE — Evaluation (Signed)
Physical Therapy Evaluation and Discharge Patient Details Name: [name] MRN: [id number] DOB: Oct 26, 1960 Today's Date: 12/03/2018   History of Present Illness  Pt is a 58 year old woman admitted for C5-7 ACDF. PMH: migraine, R THA, R foot fx, B carpal tunnel repair.  Clinical Impression  Patient evaluated by Physical Therapy with no further acute PT needs identified. All education has been completed and the patient has no further questions. Pt was able to demonstrate transfers and ambulation with gross modified independence to independence. Pt was educated on precautions, brace application/wearing schedule, appropriate activity progression, and car transfer. See below for any follow-up Physical Therapy or equipment needs. PT is signing off. Thank you for this referral.     Follow Up Recommendations No PT follow up;Supervision - Intermittent    Equipment Recommendations  None recommended by PT    Recommendations for Other Services       Precautions / Restrictions Precautions Precautions: Cervical Precaution Booklet Issued: Yes (comment) Precaution Comments: reviewed cervical precautions with patient Required Braces or Orthoses: Cervical Brace Cervical Brace: Hard collar Restrictions Weight Bearing Restrictions: No      Mobility  Bed Mobility Overal bed mobility: Modified Independent             General bed mobility comments: Pt was received up in room with OT.   Transfers Overall transfer level: Independent Equipment used: None             General transfer comment: No assist required. Pt able to complete without UE support.   Ambulation/Gait Ambulation/Gait assistance: Modified independent (Device/Increase time) Gait Distance (Feet): 400 Feet Assistive device: None Gait Pattern/deviations: Step-through pattern;Decreased stride length Gait velocity: Decreased Gait velocity interpretation: <1.8 ft/sec, indicate of risk for recurrent falls General Gait  Details: Slow but generally steady. No assist required and pt with minimal gait deviations. Held R hand up in a guarded position due to pain throughout ambulation.   Stairs Stairs: Yes Stairs assistance: Supervision Stair Management: One rail Left;Alternating pattern;Forwards Number of Stairs: 10 General stair comments: Pt completed without difficulty.   Wheelchair Mobility    Modified Rankin (Stroke Patients Only)       Balance Overall balance assessment: Modified Independent                                           Pertinent Vitals/Pain Pain Assessment: Faces Faces Pain Scale: Hurts a little bit Pain Location: incision Pain Descriptors / Indicators: Sore Pain Intervention(s): Monitored during session    Home Living Family/patient expects to be discharged to:: Private residence Living Arrangements: Spouse/significant other;Children Available Help at Discharge: Family;Available 24 hours/day Type of Home: House Home Access: Stairs to enter   [employer] of Steps: 4 Home Layout: Multi-level        Prior Function Level of Independence: Independent               Hand Dominance   Dominant Hand: Right    Extremity/Trunk Assessment   Upper Extremity Assessment Upper Extremity Assessment: Defer to OT evaluation RUE Deficits / Details: edematous, RN made aware, IV intact RUE Coordination: decreased fine motor    Lower Extremity Assessment Lower Extremity Assessment: Overall WFL for tasks assessed    Cervical / Trunk Assessment Cervical / Trunk Assessment: Other exceptions Cervical / Trunk Exceptions: s/p ACDF  Communication   Communication: No difficulties  Cognition Arousal/Alertness: Awake/alert  Behavior During Therapy: WFL for tasks assessed/performed Overall Cognitive Status: Within Functional Limits for tasks assessed                                        General Comments      Exercises      Assessment/Plan    PT Assessment Patent does not need any further PT services  PT Problem List         PT Treatment Interventions      PT Goals (Current goals can be found in the Care Plan section)  Acute Rehab PT Goals Patient Stated Goal: heal  PT Goal Formulation: All assessment and education complete, DC therapy    Frequency     Barriers to discharge        Co-evaluation               AM-PAC PT "6 Clicks" Mobility  Outcome Measure Help needed turning from your back to your side while in a flat bed without using bedrails?: None Help needed moving from lying on your back to sitting on the side of a flat bed without using bedrails?: None Help needed moving to and from a bed to a chair (including a wheelchair)?: None Help needed standing up from a chair using your arms (e.g., wheelchair or bedside chair)?: None Help needed to walk in hospital room?: None Help needed climbing 3-5 steps with a railing? : A Little 6 Click Score: 23    End of Session Equipment Utilized During Treatment: Cervical collar Activity Tolerance: Patient tolerated treatment well Patient left: with call bell/phone within reach(Sitting EOB) Nurse Communication: Mobility status PT Visit Diagnosis: Pain;Other symptoms and signs involving the nervous system (R29.898) Pain - part of body: Hand(neck)    Time: [id number] PT Time Calculation (min) (ACUTE ONLY): 14 min   Charges:   PT Evaluation $PT Eval Low Complexity: 1 Low          Rolinda Roan, PT, DPT Acute Rehabilitation Services Pager: [phone] Office: [phone]   Thelma Comp 12/03/2018, 10:39 AM

## 2018-12-03 NOTE — Progress Notes (Signed)
Patient alert and oriented, mae's well, voiding adequate amount of urine, swallowing without difficulty, no c/o pain at time of discharge. Patient discharged home with family. Script and discharged instructions given to patient. Patient and family stated understanding of instructions given. Patient has an appointment with Dr. Brooks  

## 2018-12-03 NOTE — Progress Notes (Signed)
Subjective: 1 Day Post-Op Procedure(s) (LRB): ANTERIOR CERVICAL DECOMPRESSION/DISCECTOMY FUSION CERVICAL FIVE THROUGH CERVICAL SEVEN (N/A) Patient reports pain as mild.  Radicular arm pain improved. Mild incisional site pain.  She is complaining of a migraine that started last night and did cause 1 episode on N/v. Overall, she is tolerating PO. +void +belching, - flatus, -BM. Denies stomach pain +ambulation Denies calf pain, CP, SOB, fevers/chills.     Objective: Vital signs in last 24 hours: Temp:  [96.8 F (36 C)-98.9 F (37.2 C)] 98.4 F (36.9 C) (10/15 0745) Pulse Rate:  [82-112] 109 (10/15 0745) Resp:  [10-23] 18 (10/15 0745) BP: (135-152)/(67-85) 138/77 (10/15 0745) SpO2:  [92 %-100 %] 96 % (10/15 0745)  Intake/Output from previous day: 10/14 0701 - 10/15 0700 In: 1412.7 [P.O.:240; I.V.:1089.3; IV Piggyback:83.3] Out: 50 [Blood:50] Intake/Output this shift: No intake/output data recorded.  No results for input(s): HGB in the last 72 hours. No results for input(s): WBC, RBC, HCT, PLT in the last 72 hours. No results for input(s): NA, K, CL, CO2, BUN, CREATININE, GLUCOSE, CALCIUM in the last 72 hours. No results for input(s): LABPT, INR in the last 72 hours.  Neurologically intact ABD soft Neurovascular intact Sensation intact distally Intact pulses distally Dorsiflexion/Plantar flexion intact Incision: dressing C/D/I   Assessment/Plan: 1 Day Post-Op Procedure(s) (LRB): ANTERIOR CERVICAL DECOMPRESSION/DISCECTOMY FUSION CERVICAL FIVE THROUGH CERVICAL SEVEN (N/A) Advance diet Up with therapy  Continue current pain regime DVT PPx: ambulation, teds, SCDs. Plan to D/c home today    [place] 12/03/2018, 10:18 AM

## 2018-12-04 ENCOUNTER — Encounter (HOSPITAL_COMMUNITY): Payer: Self-pay | Admitting: Orthopedic Surgery

## 2018-12-04 NOTE — Discharge Summary (Signed)
Patient ID: [name] MRN: [id number] DOB/AGE: [birth year] 58 y.o.  Admit date: 12/02/2018 Discharge date: 12/04/2018  Admission Diagnoses:  Active Problems:   Cervical disc herniation   Discharge Diagnoses:  Active Problems:   Cervical disc herniation  status post Procedure(s): ANTERIOR CERVICAL DECOMPRESSION/DISCECTOMY FUSION CERVICAL FIVE THROUGH CERVICAL SEVEN  Past Medical History:  Diagnosis Date  . Complication of anesthesia   . Migraine   . PONV (postoperative nausea and vomiting)     Surgeries: Procedure(s): ANTERIOR CERVICAL DECOMPRESSION/DISCECTOMY FUSION CERVICAL FIVE THROUGH CERVICAL SEVEN on 12/02/2018   Consultants:   Discharged Condition: Improved  Hospital Course: [name] is an 58 y.o. 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 Patient failed conservative treatments (please see the history and physical for the specifics) and had severe unremitting pain that affects sleep, daily activities and work/hobbies. After pre-op clearance, the patient was taken to the operating room on 12/02/2018 and underwent  Procedure(s): ANTERIOR CERVICAL DECOMPRESSION/DISCECTOMY FUSION [place].    Patient was given perioperative antibiotics:  Anti-infectives (From admission, onward)   Start     Dose/Rate Route Frequency Ordered Stop   12/02/18 2100  ceFAZolin (ANCEF) IVPB 1 g/50 mL premix     1 g 100 mL/hr over 30 Minutes Intravenous Every 8 hours 12/02/18 1824 12/03/18 0510   12/02/18 0936  ceFAZolin (ANCEF) 2-4 GM/100ML-% IVPB    Note to Pharmacy: Alvy Beal   : cabinet override      12/02/18 0936 12/02/18 1245   12/02/18 0929  ceFAZolin (ANCEF) IVPB 2g/100 mL premix     2 g 200 mL/hr over 30 Minutes Intravenous 30 min pre-op 12/02/18 0929 12/02/18 1300       Patient was given sequential compression devices and early ambulation to prevent DVT.   Patient  benefited maximally from hospital stay and there were no complications. At the time of discharge, the patient was urinating/moving their bowels without difficulty, tolerating a regular diet, pain is controlled with oral pain medications and they have been cleared by PT/OT.   Recent vital signs: No data found.   Recent laboratory studies: No results for input(s): WBC, HGB, HCT, PLT, NA, K, CL, CO2, BUN, CREATININE, GLUCOSE, INR, CALCIUM in the last 72 hours.  Invalid input(s): PT, 2   Discharge Medications:   Allergies as of 12/03/2018   No Known Allergies     Medication List    STOP taking these medications   diclofenac 75 MG EC tablet Commonly known as: VOLTAREN     TAKE these medications   Alaway 0.025 % ophthalmic solution Generic drug: ketotifen Place 1 drop into both eyes 2 (two) times daily as needed (allergies).   gabapentin 300 MG capsule Commonly known as: NEURONTIN Take 300 mg by mouth 3 (three) times daily.   methocarbamol 500 MG tablet Commonly known as: Robaxin Take 1 tablet (500 mg total) by mouth every 8 (eight) hours as needed for up to 5 days for muscle spasms.   ondansetron 4 MG tablet Commonly known as: ZOFRAN TAKE ONE TABLET BY MOUTH EVERY 8 HOURS AS NEEDED FOR NAUSEA WITH MIGRAINES What changed: Another medication with the same name was added. Make sure you understand how and when to take each.   ondansetron 4 MG tablet Commonly known as: Zofran Take 1 tablet (4 mg total) by mouth every 8 (eight) hours as needed for nausea or vomiting. What changed: You were already taking a medication  with the same name, and this prescription was added. Make sure you understand how and when to take each.   oxyCODONE-acetaminophen 10-325 MG tablet Commonly known as: Percocet Take 1 tablet by mouth every 6 (six) hours as needed for up to 5 days for pain.   SUMAtriptan 20 MG/ACT nasal spray Commonly known as: IMITREX Place 1 spray (20 mg total) into the nose every  2 (two) hours as needed for migraine or headache. May repeat in 2 hrs if headache persists       Diagnostic Studies: Dg Chest 2 View  Result Date: 11/24/2018 CLINICAL DATA:  Preop ACDF. EXAM: CHEST - 2 VIEW COMPARISON:  None. FINDINGS: The heart size and mediastinal contours are within normal limits. Both lungs are clear. The visualized skeletal structures are unremarkable. IMPRESSION: No active cardiopulmonary disease. Electronically Signed   By: Marin Olp M.D.   On: 11/24/2018 15:56   Dg Cervical Spine 2-3 Views  Result Date: 12/02/2018 CLINICAL DATA:  ACDF EXAM: DG C-ARM 1-60 MIN; CERVICAL SPINE - 2-3 VIEW CONTRAST:  None FLUOROSCOPY TIME:  Fluoroscopy Time:  47 seconds Number of Acquired Spot Images: 3 COMPARISON:  None. FINDINGS: Three low resolution intraoperative spot views of the cervical spine. Total fluoroscopy time was 47 seconds. The images demonstrate anterior plate and screw fixation C5 through C7 with interbody devices at C5-C6 and C6-C7. IMPRESSION: Intraoperative fluoroscopic assistance provided during cervical spine surgery Electronically Signed   By: Donavan Foil M.D.   On: 12/02/2018 19:26   Dg C-arm 1-60 Min  Result Date: 12/02/2018 CLINICAL DATA:  ACDF EXAM: DG C-ARM 1-60 MIN; CERVICAL SPINE - 2-3 VIEW CONTRAST:  None FLUOROSCOPY TIME:  Fluoroscopy Time:  47 seconds Number of Acquired Spot Images: 3 COMPARISON:  None. FINDINGS: Three low resolution intraoperative spot views of the cervical spine. Total fluoroscopy time was 47 seconds. The images demonstrate anterior plate and screw fixation C5 through C7 with interbody devices at C5-C6 and C6-C7. IMPRESSION: Intraoperative fluoroscopic assistance provided during cervical spine surgery Electronically Signed   By: Donavan Foil M.D.   On: 12/02/2018 19:26    Discharge Instructions    Incentive spirometry RT   Complete by: As directed       Follow-up Information    Melina Schools, MD. Schedule an appointment as soon  as possible for a visit in 2 weeks.   Specialty: Orthopedic Surgery Why: If symptoms worsen, For suture removal, For wound re-check Contact information: [address] [place] [address] [id number]           Discharge Plan:  discharge to home  Disposition: stable    Signed: Yvonne Kendall Ward for [facility] PA-C [facility] [phone] 12/04/2018, 8:12 AM

## 2019-03-15 ENCOUNTER — Other Ambulatory Visit: Payer: Self-pay

## 2019-03-15 ENCOUNTER — Other Ambulatory Visit: Payer: Self-pay | Admitting: Internal Medicine

## 2019-03-15 ENCOUNTER — Ambulatory Visit (INDEPENDENT_AMBULATORY_CARE_PROVIDER_SITE_OTHER): Payer: 59 | Admitting: Internal Medicine

## 2019-03-15 ENCOUNTER — Ambulatory Visit (INDEPENDENT_AMBULATORY_CARE_PROVIDER_SITE_OTHER)
Admission: RE | Admit: 2019-03-15 | Discharge: 2019-03-15 | Disposition: A | Discharge status: Home / Self Care | Payer: 59 | Source: Ambulatory Visit | Attending: Internal Medicine | Admitting: Internal Medicine

## 2019-03-15 ENCOUNTER — Encounter: Payer: Self-pay | Admitting: Internal Medicine

## 2019-03-15 DIAGNOSIS — G4489 Other headache syndrome: Secondary | ICD-10-CM

## 2019-03-15 DIAGNOSIS — G4453 Primary thunderclap headache: Secondary | ICD-10-CM

## 2019-03-15 MED ORDER — SUMATRIPTAN 20 MG/ACT NA SOLN: 20.0000 mg | NASAL | 8 refills | Status: DC | PRN

## 2019-03-15 NOTE — Progress Notes (Addendum)
Virtual Visit via Video Note  I connected with [name] on 03/15/19 at 11:15 AM EST by a video enabled telemedicine application and verified that I am speaking with the correct person using two identifiers.   I discussed the limitations of evaluation and management by telemedicine and the availability of in person appointments. The patient expressed understanding and agreed to proceed.  Present for the visit:  Myself, Dr Billey Gosling, Charlyne Petrin.  The patient is currently at home and I am in the office.    No referring provider.    History of Present Illness: This is an acute visit for severe headache.   She took a phone call Friday and full code was less than 2 minutes.  She had sudden onset of a severe 10/10 headache on the top of her head only.  She does have a long history of migraines and this was not one of her migraines.  She felt like her head was going to explode.  She put ice on the top of her head and the severe pain lasted approximately 10 minutes.  During that time she had difficulty even speaking because of the severity of the pain.  She denied any nausea, change in vision or other neurological symptoms.  She was unsure what else to do so she did treated like a migraine even though she knew it was not one.  She took her nasal spray at 630 and again at 830.  She took her antinausea medication.  Somehow she was able to get to sleep.  She has had a nagging headache on the top of her head since then.  Yesterday morning around 4 AM and again this morning she had sudden onset of the severe pain again.  Severe pain only located on the top of the head.  Yesterday afternoon was Time for a brief period of time she did not have a headache.  She has not had any dizziness, numbness/tingling, confusion or balance issues.  There has been no changes in vision, fever or nausea.  She is trying to stay hydrated.  She did have some bad news-she is now unemployed and the phone call she  received on Friday was advising her that she did not get a job she applied for, but she knew they were not going to hire her because they had an internal candidate.  She has never had any headache like this before in the past and if the pain continued at that severe intensity she would have gone to the emergency room.     Review of Systems  Constitutional: Negative for fever.  Eyes: Negative for blurred vision and double vision.  Gastrointestinal: Negative for nausea.  Neurological: Positive for headaches. Negative for dizziness and tingling.  Psychiatric/Behavioral:       No confusion, no balance issues      Social History   Socioeconomic History  . Marital status: Married    Spouse name: Not on file  . Number of children: Not on file  . Years of education: Not on file  . Highest education level: Not on file  Occupational History  . Not on file  Tobacco Use  . Smoking status: Never Smoker  . Smokeless tobacco: Never Used  Substance and Sexual Activity  . Alcohol use: Yes    Comment: 1-2 glasses/week  . Drug use: No  . Sexual activity: Not on file  Other Topics Concern  . Not on file  Social History Narrative  . Not  on file   Social Determinants of Health   Financial Resource Strain:   . Difficulty of Paying Living Expenses: Not on file  Food Insecurity:   . Worried About Charity fundraiser in the Last Year: Not on file  . Ran Out of Food in the Last Year: Not on file  Transportation Needs:   . Lack of Transportation (Medical): Not on file  . Lack of Transportation (Non-Medical): Not on file  Physical Activity:   . Days of Exercise per Week: Not on file  . Minutes of Exercise per Session: Not on file  Stress:   . Feeling of Stress : Not on file  Social Connections:   . Frequency of Communication with Friends and Family: Not on file  . Frequency of Social Gatherings with Friends and Family: Not on file  . Attends Religious Services: Not on file  . Active  Member of Clubs or Organizations: Not on file  . Attends Archivist Meetings: Not on file  . Marital Status: Not on file     Observations/Objective: Appears well in NAD Breathing normally Holding bag of ice on the top of her head No obvious focal neurological deficits Mood and affect, thought process normal, speech normal Pain level currently 4-5/10  Last blood pressure she checked over the weekend was 145/95  Assessment and Plan:  See Problem List for Assessment and Plan of chronic medical problems.   Called patient to review CT scan.  She does feel better than this morning.  Headache pain currently 2/10.  Discussed that she should still see a neurologist to see if further evaluation is necessary.  Referred today. Can continue Imitrex nasal spray-she is unsure if it is really helping.  She does have some Percocet at home and advised she can try that if needed.  Continue to use the ice packs. Advised that if her headache persists and does not go away she needs to go to the emergency room.  Discussed that she may need further evaluation to rule out a bleed or other potential causes  She will update me in a day or 2 and will call with any questions or concerns   Follow Up Instructions:    I discussed the assessment and treatment plan with the patient. The patient was provided an opportunity to ask questions and all were answered. The patient agreed with the plan and demonstrated an understanding of the instructions.   The patient was advised to call back or seek an in-person evaluation if the symptoms worsen or if the condition fails to improve as anticipated.    Binnie Rail, MD

## 2019-03-15 NOTE — Assessment & Plan Note (Signed)
Severe headache, sudden onset 3 days ago with a persistent nagging headache.  Severe headache again yesterday morning and again today Severity of headache 10/10, only located on the top of the head No other neurological symptoms or signs This is not one of her migraines Only ice on the top of the head helps CT of the head without contrast stat Management after results of CT urine

## 2019-04-05 ENCOUNTER — Other Ambulatory Visit: Payer: Self-pay

## 2019-04-05 ENCOUNTER — Encounter: Payer: Self-pay | Admitting: Internal Medicine

## 2019-04-22 ENCOUNTER — Ambulatory Visit: Payer: 59 | Admitting: Neurology

## 2019-08-19 ENCOUNTER — Other Ambulatory Visit: Payer: Self-pay | Admitting: Internal Medicine

## 2019-10-15 ENCOUNTER — Encounter: Payer: Self-pay | Admitting: Internal Medicine

## 2020-07-20 IMAGING — RF DG C-ARM 1-60 MIN
1 series · 3 of 3 positions shown · IV contrast (agent unspecified)
Comparison: None.

CLINICAL DATA: ACDF

EXAM:
DG C-ARM 1-60 MIN; CERVICAL SPINE - 2-3 VIEW
CONTRAST:  None
FLUOROSCOPY TIME:  Fluoroscopy Time:  47 seconds
Number of Acquired Spot Images: 3

[Series 1: run · 3 of 3 slices shown]
[im 1/3]
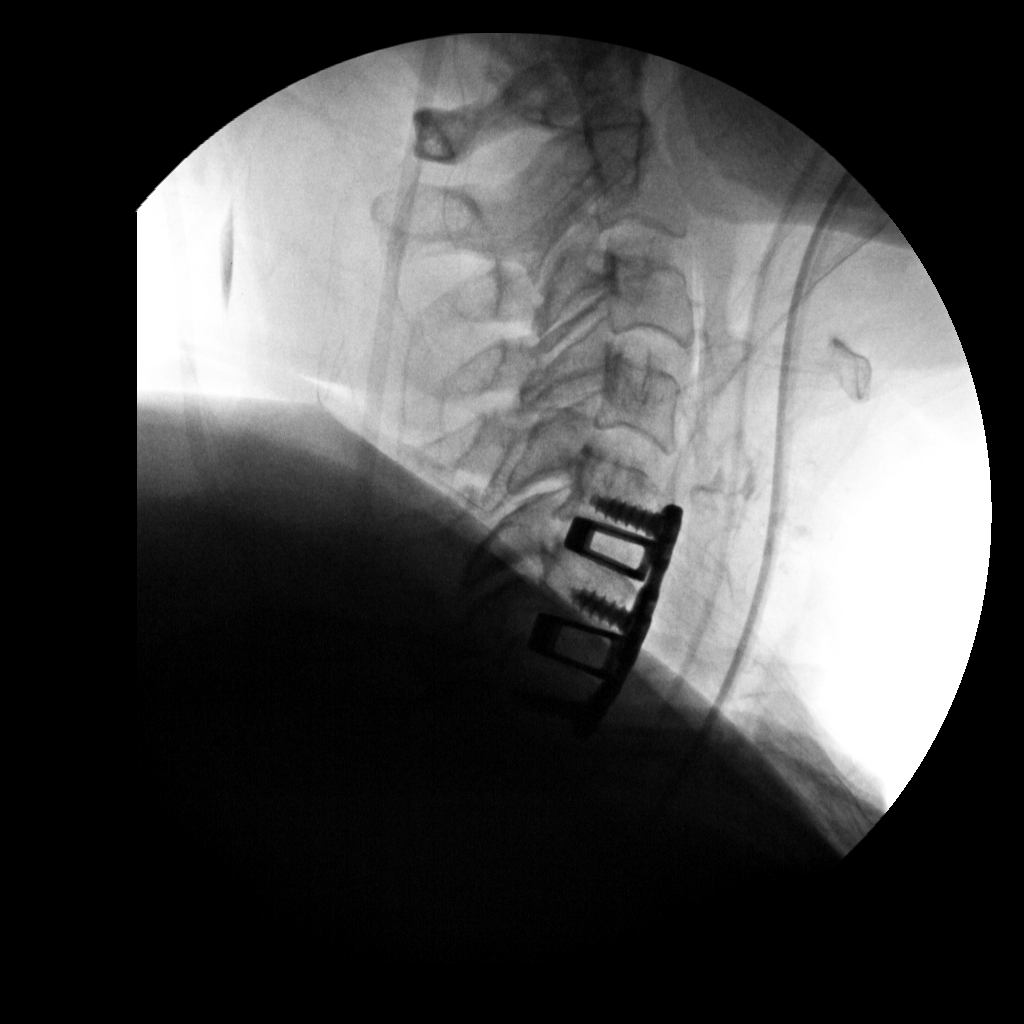
[im 2/3]
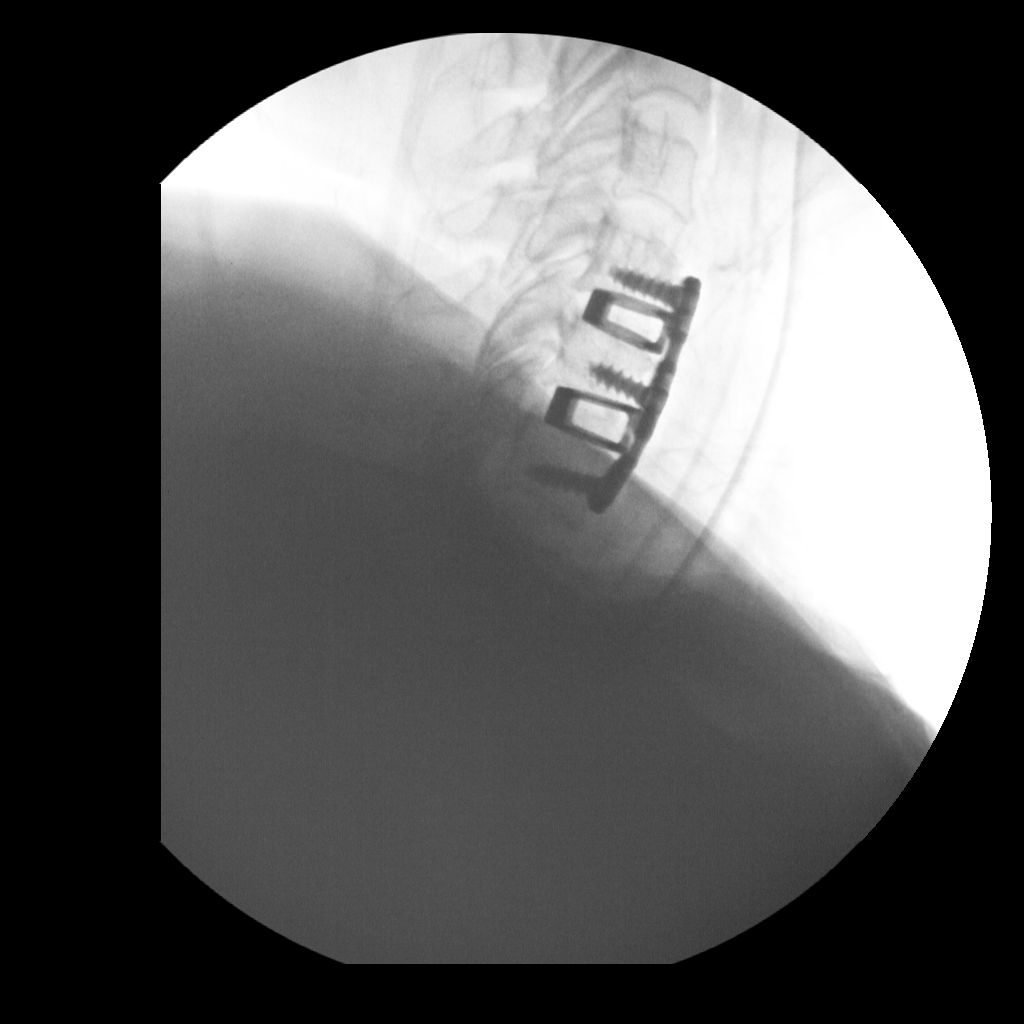
[im 3/3]
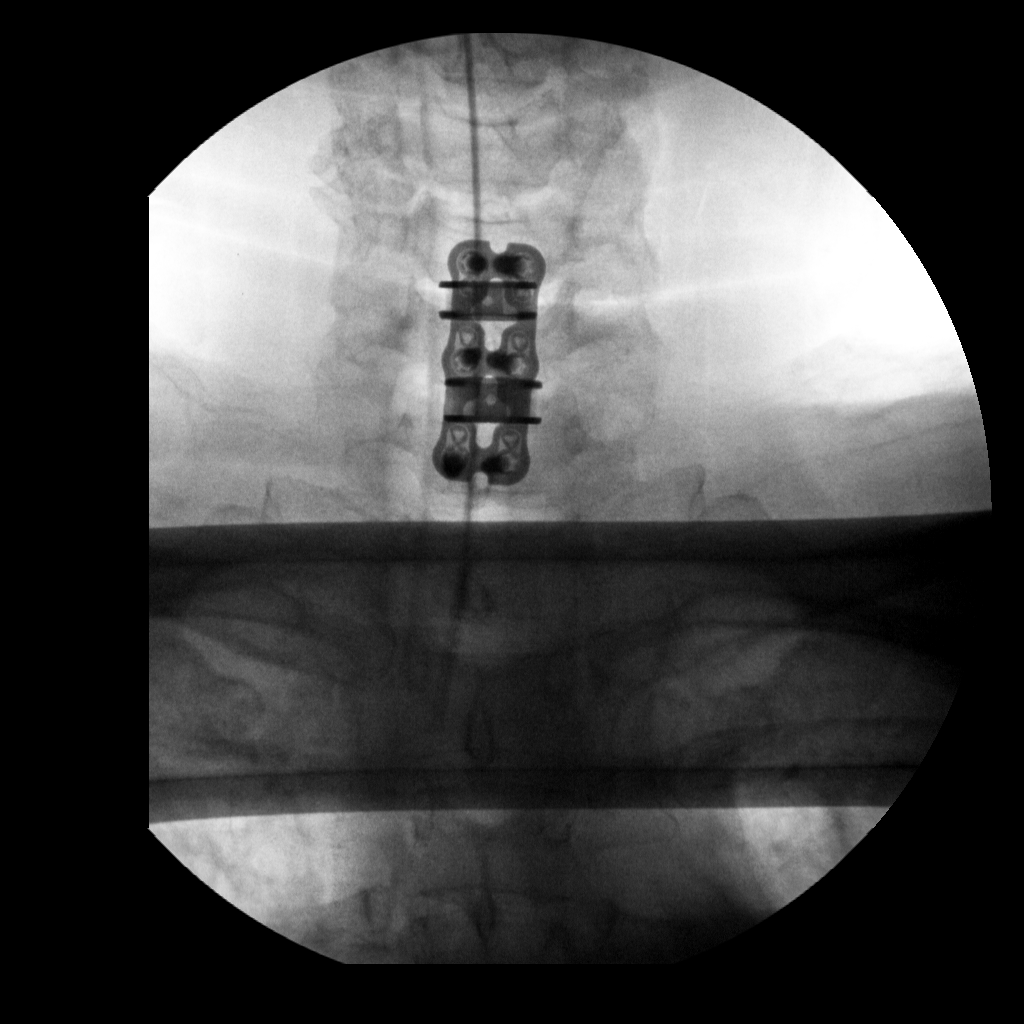

[3 of 3 positions shown; findings below may reference images not displayed]

FINDINGS: Three low resolution intraoperative spot views of the cervical
spine. Total fluoroscopy time was 47 seconds. The images demonstrate
anterior plate and screw fixation C5 through C7 with interbody
devices at C5-C6 and C6-C7.
IMPRESSION: Intraoperative fluoroscopic assistance provided during cervical
spine surgery

## 2020-08-22 LAB — HM DEXA SCAN: HM Dexa Scan: NORMAL

## 2020-10-31 IMAGING — CT CT HEAD W/O CM
3 series · 15 of 46 positions shown, 18 images · non-contrast
Comparison: None.

CLINICAL DATA: Severe headache for several days

EXAM:
CT HEAD WITHOUT CONTRAST
TECHNIQUE: Contiguous axial images were obtained from the base of the skull
through the vertex without intravenous contrast.

[Series 2: head 5.0 h37s · axial · 0.39mm/px · z∈[+155,+275]mm · 9 of 29 slices shown, 12 images]
[im 3/29  brain]
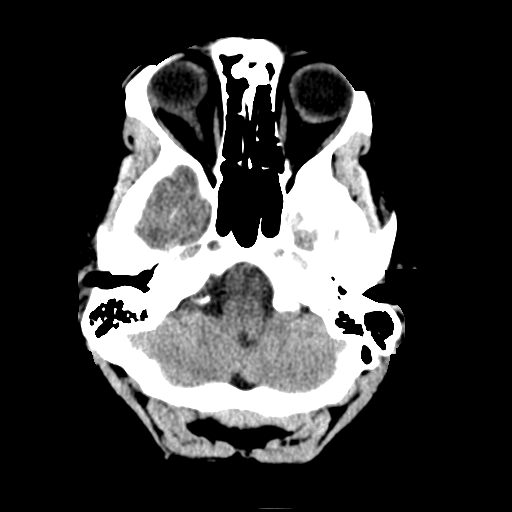
[im 3/29  bone]
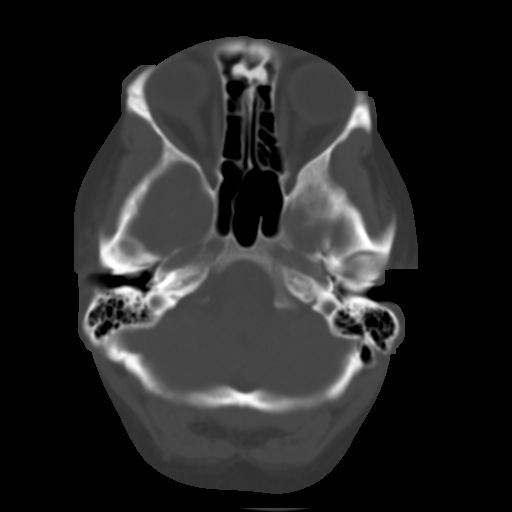
[im 6/29  brain]
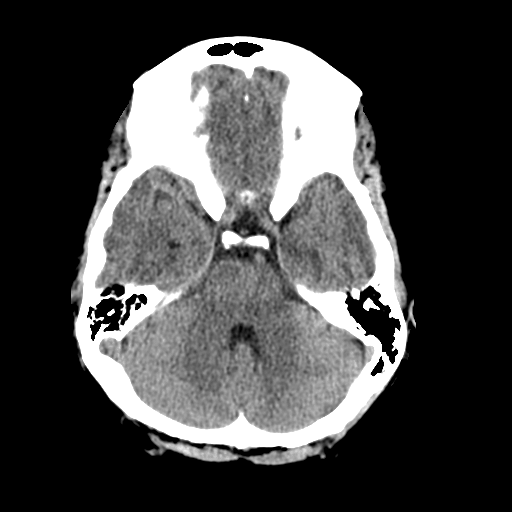
[im 9/29  brain]
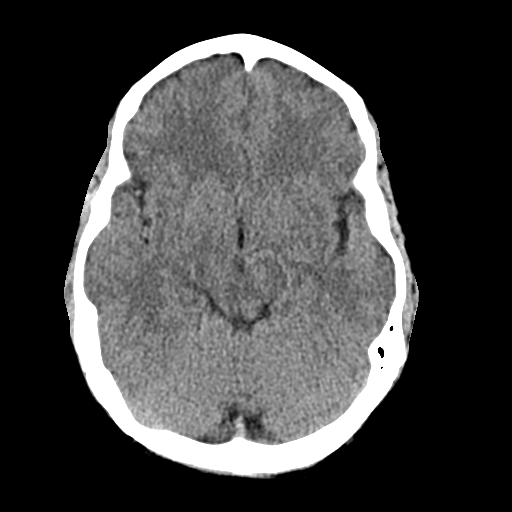
[im 12/29  brain]
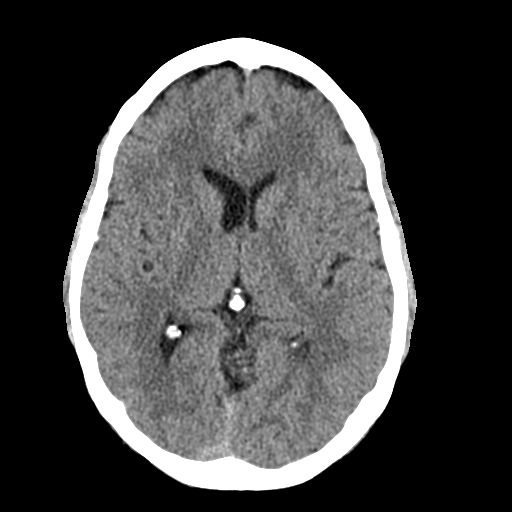
[im 15/29  brain]
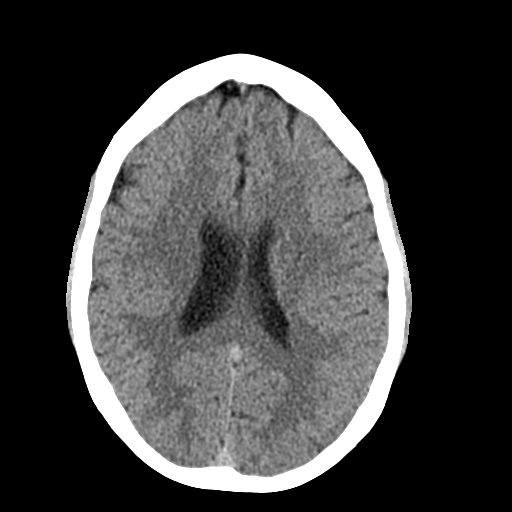
[im 15/29  bone]
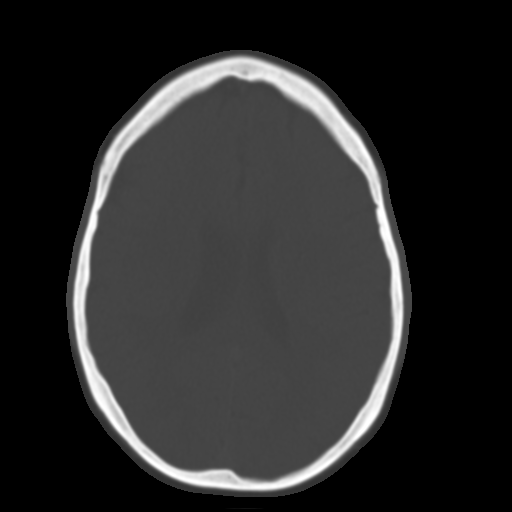
[im 18/29  brain]
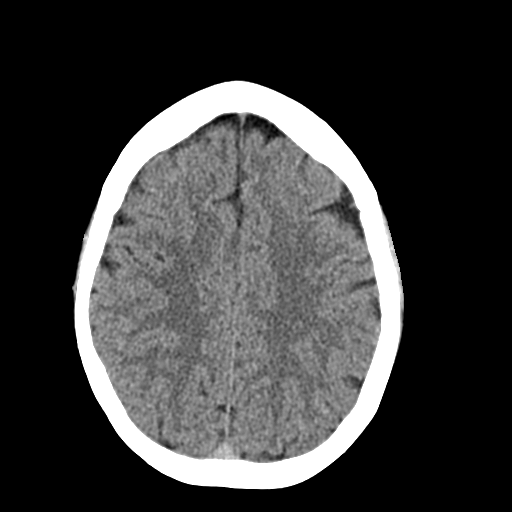
[im 21/29  brain]
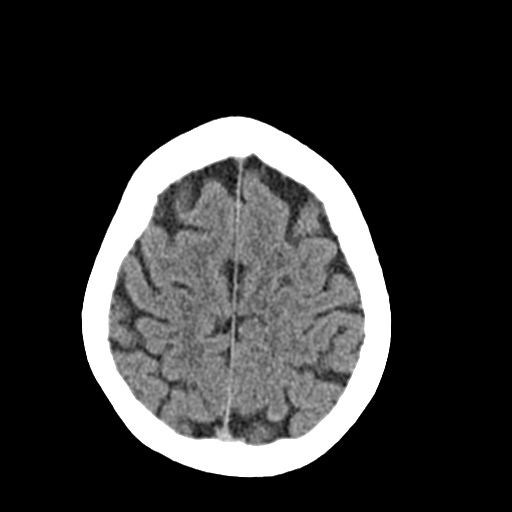
[im 24/29  brain]
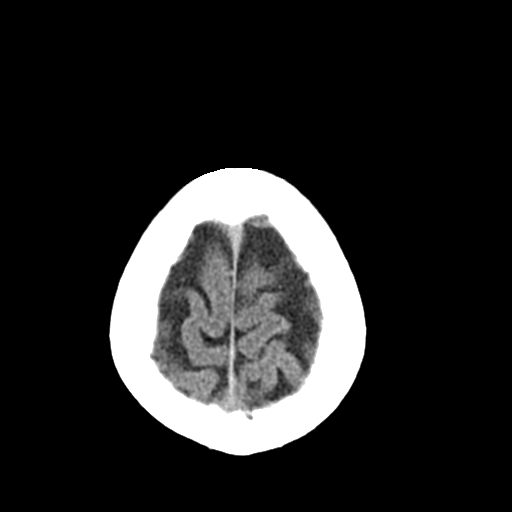
[im 27/29  brain]
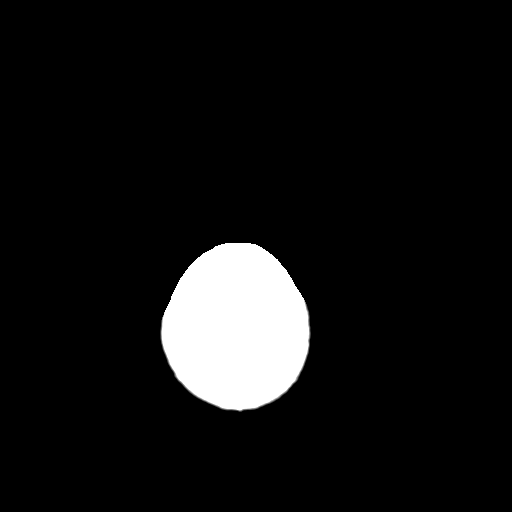
[im 27/29  bone]
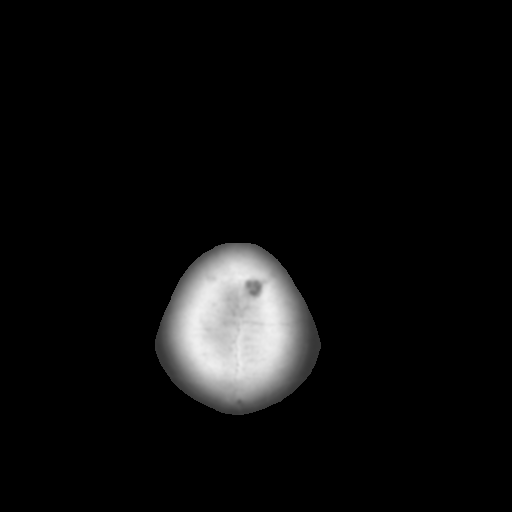

[Series 4: head 3.0 mpr cor · coronal · 0.29mm/px · 3 of 60 slices shown]
[im 20/60  brain]
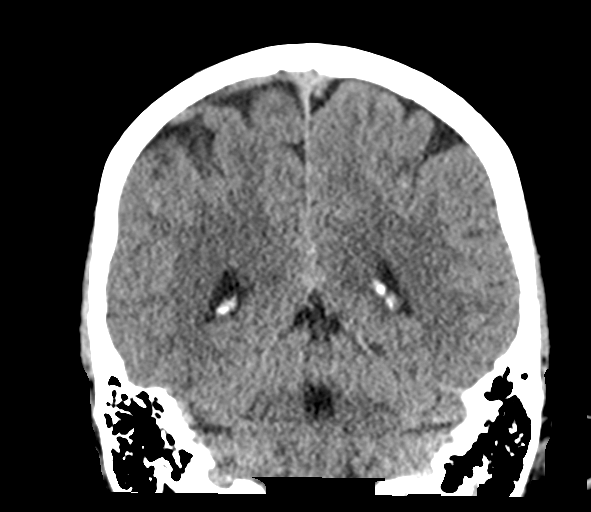
[im 27/60  brain]
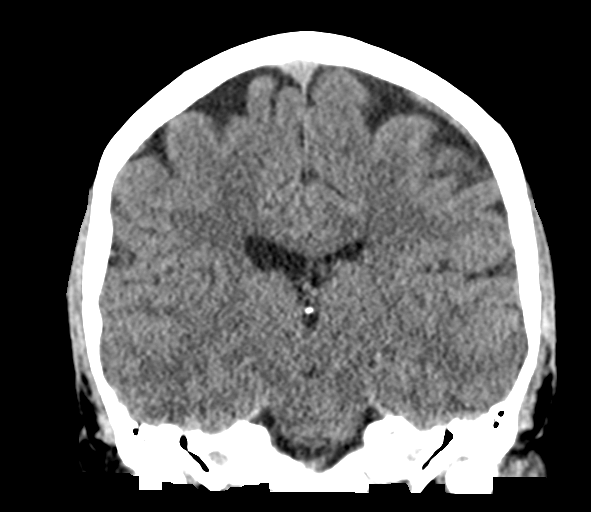
[im 33/60  brain]
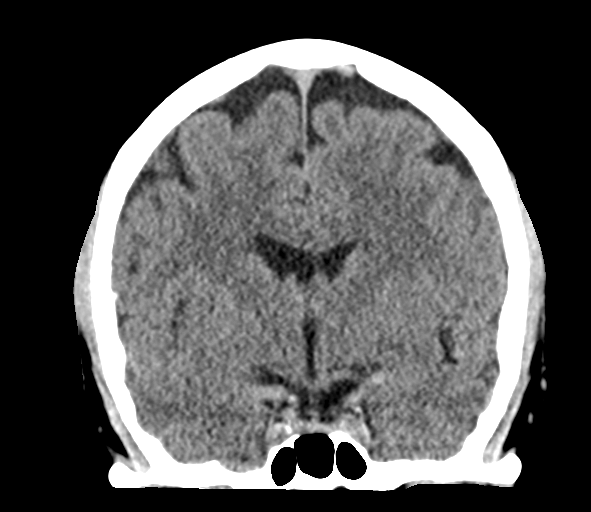

[Series 5: head 3.0 mpr sag · sagittal · 0.29mm/px · 3 of 50 slices shown]
[im 17/50  brain]
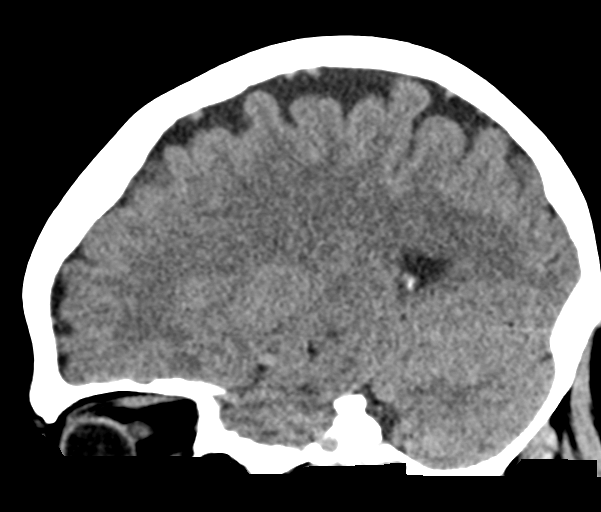
[im 25/50  brain]
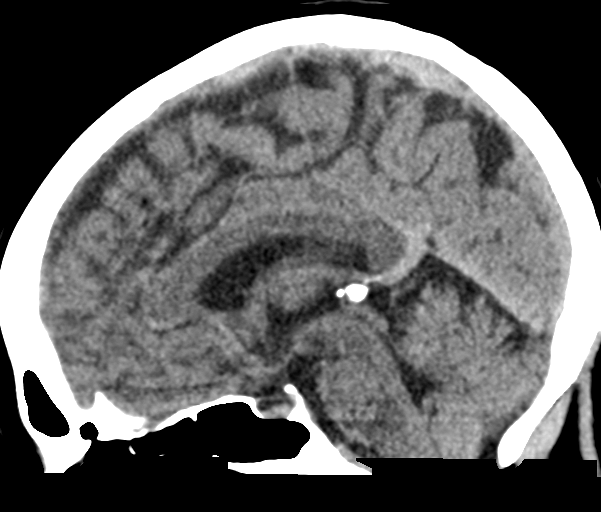
[im 33/50  brain]
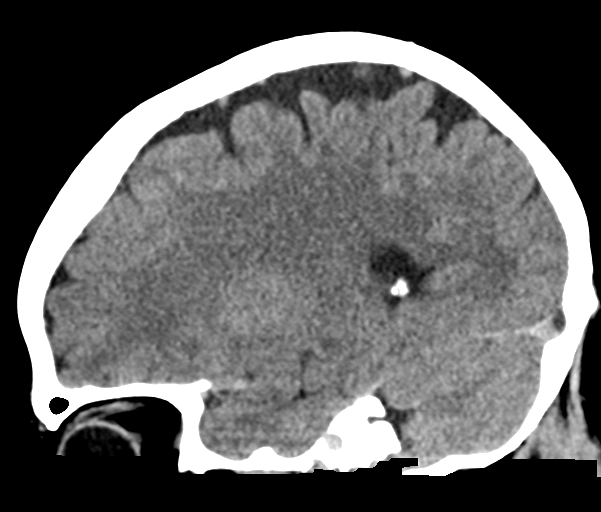

[15 of 46 positions shown; findings below may reference images not displayed]

FINDINGS: Brain: Ventricles are normal in size and configuration. Note that
the right lateral ventricle is larger than the left lateral
ventricle, an apparent anatomic variant. There is borderline frontal
atrophy bilaterally. Cerebellar tonsils are toward the lower limits
of normal in placement. There is no intracranial mass, hemorrhage,
extra-axial fluid collection, or midline shift. The brain parenchyma
appears unremarkable. No evident acute infarct.

Vascular: There is no hyperdense vessel. No evident vascular
calcification.

Skull: The bony calvarium appears intact.

Sinuses/Orbits: There is mild mucosal thickening in several ethmoid
air cells. Other visualized paranasal sinuses are clear. Visualized
orbits appear symmetric bilaterally.

Other: Visualized mastoid air cells are clear.
IMPRESSION: Slight frontal atrophy. Ventricles are not enlarged. Right lateral
ventricle is slightly larger than the left lateral ventricle, a
presumed anatomic variant.

Brain parenchyma appears unremarkable.  No mass or hemorrhage.

There is mild mucosal thickening in several ethmoid air cells.

## 2021-01-17 ENCOUNTER — Encounter: Payer: Self-pay | Admitting: Internal Medicine

## 2021-07-08 ENCOUNTER — Encounter: Payer: Self-pay | Admitting: Internal Medicine

## 2021-07-09 MED ORDER — RIZATRIPTAN BENZOATE 10 MG PO TBDP: 10.0000 mg | ORAL_TABLET | ORAL | 0 refills | Status: DC | PRN

## 2021-10-15 LAB — HM MAMMOGRAPHY

## 2021-10-15 LAB — HM PAP SMEAR: HM Pap smear: NORMAL

## 2021-10-18 NOTE — Progress Notes (Signed)
Subjective:    Patient ID: [name], female    DOB: 1960-02-22, 61 y.o.   MRN: [id number]      HPI [name] is here for a Physical exam.    Start calcium and vitamin D    Medications and allergies reviewed with patient and updated if appropriate.  Current Outpatient Medications on File Prior to Visit  Medication Sig Dispense Refill   ketotifen (ALAWAY) 0.025 % ophthalmic solution Place 1 drop into both eyes 2 (two) times daily as needed (allergies).     rizatriptan (MAXALT-MLT) 10 MG disintegrating tablet Take 1 tablet (10 mg total) by mouth as needed for migraine. May repeat in 2 hours if needed 10 tablet 0   No current facility-administered medications on file prior to visit.    Review of Systems  Constitutional:  Negative for fever.  Eyes:  Negative for visual disturbance.  Respiratory:  Negative for cough, shortness of breath and wheezing.   Cardiovascular:  Negative for chest pain, palpitations and leg swelling.  Gastrointestinal:  Positive for anal bleeding (occ -hemorrhoid). Negative for abdominal pain, blood in stool, constipation, diarrhea and nausea.       No gerd  Genitourinary:  Negative for dysuria.  Musculoskeletal:  Positive for arthralgias. Negative for back pain (hip pain).  Skin:  Negative for rash.  Neurological:  Positive for headaches (migraines). Negative for dizziness and light-headedness.  Psychiatric/Behavioral:  Negative for dysphoric mood. The patient is not nervous/anxious.        Objective:   Vitals:   10/19/21 0855  BP: 118/78  Pulse: 88  Temp: 98 F (36.7 C)  SpO2: 98%   Filed Weights   10/19/21 0855  Weight: 203 lb (92.1 kg)   Body mass index is 32.77 kg/m.  BP Readings from Last 3 Encounters:  10/19/21 118/78  12/03/18 138/77  11/24/18 140/79    Wt Readings from Last 3 Encounters:  10/19/21 203 lb (92.1 kg)  12/02/18 204 lb 11.2 oz (92.9 kg)  11/24/18 204 lb 11.2 oz (92.9 kg)       Physical  Exam Constitutional: She appears well-developed and well-nourished. No distress.  HENT:  Head: Normocephalic and atraumatic.  Right Ear: External ear normal. Normal ear canal and TM Left Ear: External ear normal.  Normal ear canal and TM Mouth/Throat: Oropharynx is clear and moist.  Eyes: Conjunctivae normal.  Neck: Neck supple. No tracheal deviation present. No thyromegaly present.  No carotid bruit  Cardiovascular: Normal rate, regular rhythm and normal heart sounds.   No murmur heard.  No edema. Pulmonary/Chest: Effort normal and breath sounds normal. No respiratory distress. She has no wheezes. She has no rales.  Breast: deferred   Abdominal: Soft. She exhibits no distension. There is no tenderness.  Lymphadenopathy: She has no cervical adenopathy.  Skin: Skin is warm and dry. She is not diaphoretic.  Psychiatric: She has a normal mood and affect. Her behavior is normal.     Lab Results  Component Value Date   WBC 7.2 11/24/2018   HGB 14.0 11/24/2018   HCT 42.6 11/24/2018   PLT 270 11/24/2018   GLUCOSE 99 11/24/2018   CHOL 262 (H) 11/03/2018   TRIG 219.0 (H) 11/03/2018   HDL 40.20 11/03/2018   LDLDIRECT 188.0 11/03/2018   ALT 72 (H) 11/05/2018   AST 25 11/05/2018   NA 137 11/24/2018   K 3.9 11/24/2018   CL 101 11/24/2018   CREATININE 0.70 11/24/2018   BUN 11 11/24/2018  CO2 27 11/24/2018   TSH 2.25 11/03/2018   INR 1.0 11/24/2018   HGBA1C 6.1 11/03/2018         Assessment & Plan:   Physical exam: Screening blood work  ordered Exercise not regular-dealing with hip issues - seeing ortho Weight working on weight loss-has lost weight -- doing intermittent fasting Substance abuse  none  Recommended starting calcium and vitamin D and/or multivitamin  Reviewed recommended immunizations.  Tdap today.     Health Maintenance  Topic Date Due   PAP SMEAR-Modifier  03/15/2016   TETANUS/TDAP  02/19/2019   MAMMOGRAM  12/17/2019   COVID-19 Vaccine (1) 11/04/2021  (Originally 07/18/1961)   INFLUENZA VACCINE  05/19/2022 (Originally 09/18/2021)   COLONOSCOPY (Pts 45-29yr Insurance coverage will need to be confirmed)  05/08/2023   Hepatitis C Screening  Completed   HIV Screening  Completed   HPV VACCINES  Aged Out   Zoster Vaccines- Shingrix  Discontinued          See Problem List for Assessment and Plan of chronic medical problems.

## 2021-10-18 NOTE — Patient Instructions (Addendum)
Tetanus vaccine today   Blood work was ordered.     Medications changes include :   none   Your prescription(s) have been sent to your pharmacy.     Return in about 1 year (around 10/20/2022) for Physical Exam.   Health Maintenance, Female Adopting a healthy lifestyle and getting preventive care are important in promoting health and wellness. Ask your health care provider about: The right schedule for you to have regular tests and exams. Things you can do on your own to prevent diseases and keep yourself healthy. What should I know about diet, weight, and exercise? Eat a healthy diet  Eat a diet that includes plenty of vegetables, fruits, low-fat dairy products, and lean protein. Do not eat a lot of foods that are high in solid fats, added sugars, or sodium. Maintain a healthy weight Body mass index (BMI) is used to identify weight problems. It estimates body fat based on height and weight. Your health care provider can help determine your BMI and help you achieve or maintain a healthy weight. Get regular exercise Get regular exercise. This is one of the most important things you can do for your health. Most adults should: Exercise for at least 150 minutes each week. The exercise should increase your heart rate and make you sweat (moderate-intensity exercise). Do strengthening exercises at least twice a week. This is in addition to the moderate-intensity exercise. Spend less time sitting. Even light physical activity can be beneficial. Watch cholesterol and blood lipids Have your blood tested for lipids and cholesterol at 60 years of age, then have this test every 5 years. Have your cholesterol levels checked more often if: Your lipid or cholesterol levels are high. You are older than 60 years of age. You are at high risk for heart disease. What should I know about cancer screening? Depending on your health history and family history, you may need to have cancer screening  at various ages. This may include screening for: Breast cancer. Cervical cancer. Colorectal cancer. Skin cancer. Lung cancer. What should I know about heart disease, diabetes, and high blood pressure? Blood pressure and heart disease High blood pressure causes heart disease and increases the risk of stroke. This is more likely to develop in people who have high blood pressure readings or are overweight. Have your blood pressure checked: Every 3-5 years if you are 63-49 years of age. Every year if you are 51 years old or older. Diabetes Have regular diabetes screenings. This checks your fasting blood sugar level. Have the screening done: Once every three years after age 75 if you are at a normal weight and have a low risk for diabetes. More often and at a younger age if you are overweight or have a high risk for diabetes. What should I know about preventing infection? Hepatitis B If you have a higher risk for hepatitis B, you should be screened for this virus. Talk with your health care provider to find out if you are at risk for hepatitis B infection. Hepatitis C Testing is recommended for: Everyone born from [birth year] through 1964. Anyone with known risk factors for hepatitis C. Sexually transmitted infections (STIs) Get screened for STIs, including gonorrhea and chlamydia, if: You are sexually active and are younger than 60 years of age. You are older than 60 years of age and your health care provider tells you that you are at risk for this type of infection. Your sexual activity has changed since you were last  screened, and you are at increased risk for chlamydia or gonorrhea. Ask your health care provider if you are at risk. Ask your health care provider about whether you are at high risk for HIV. Your health care provider may recommend a prescription medicine to help prevent HIV infection. If you choose to take medicine to prevent HIV, you should first get tested for HIV. You should then  be tested every 3 months for as long as you are taking the medicine. Pregnancy If you are about to stop having your period (premenopausal) and you may become pregnant, seek counseling before you get pregnant. Take 400 to 800 micrograms (mcg) of folic acid every day if you become pregnant. Ask for birth control (contraception) if you want to prevent pregnancy. Osteoporosis and menopause Osteoporosis is a disease in which the bones lose minerals and strength with aging. This can result in bone fractures. If you are 37 years old or older, or if you are at risk for osteoporosis and fractures, ask your health care provider if you should: Be screened for bone loss. Take a calcium or vitamin D supplement to lower your risk of fractures. Be given hormone replacement therapy (HRT) to treat symptoms of menopause. Follow these instructions at home: Alcohol use Do not drink alcohol if: Your health care provider tells you not to drink. You are pregnant, may be pregnant, or are planning to become pregnant. If you drink alcohol: Limit how much you have to: 0-1 drink a day. Know how much alcohol is in your drink. In the [place] one drink equals one 12 oz bottle of beer (355 mL), one 5 oz glass of wine (148 mL), or one 1 oz glass of hard liquor (44 mL). Lifestyle Do not use any products that contain nicotine or tobacco. These products include cigarettes, chewing tobacco, and vaping devices, such as e-cigarettes. If you need help quitting, ask your health care provider. Do not use street drugs. Do not share needles. Ask your health care provider for help if you need support or information about quitting drugs. General instructions Schedule regular health, dental, and eye exams. Stay current with your vaccines. Tell your health care provider if: You often feel depressed. You have ever been abused or do not feel safe at home. Summary Adopting a healthy lifestyle and getting preventive care are important in  promoting health and wellness. Follow your health care provider's instructions about healthy diet, exercising, and getting tested or screened for diseases. Follow your health care provider's instructions on monitoring your cholesterol and blood pressure. This information is not intended to replace advice given to you by your health care provider. Make sure you discuss any questions you have with your health care provider. Document Revised: 06/26/2020 Document Reviewed: 06/26/2020 Elsevier Patient Education  [place].

## 2021-10-19 ENCOUNTER — Encounter: Payer: Self-pay | Admitting: Internal Medicine

## 2021-10-19 ENCOUNTER — Ambulatory Visit (INDEPENDENT_AMBULATORY_CARE_PROVIDER_SITE_OTHER): Payer: BC Managed Care – PPO | Admitting: Internal Medicine

## 2021-10-19 VITALS — BP 118/78 | HR 88 | Temp 98.0°F | Ht 66.0 in | Wt 203.0 lb

## 2021-10-19 DIAGNOSIS — Z0000 Encounter for general adult medical examination without abnormal findings: Secondary | ICD-10-CM | POA: Diagnosis not present

## 2021-10-19 DIAGNOSIS — E782 Mixed hyperlipidemia: Secondary | ICD-10-CM | POA: Diagnosis not present

## 2021-10-19 DIAGNOSIS — R7303 Prediabetes: Secondary | ICD-10-CM | POA: Diagnosis not present

## 2021-10-19 DIAGNOSIS — G43109 Migraine with aura, not intractable, without status migrainosus: Secondary | ICD-10-CM

## 2021-10-19 DIAGNOSIS — Z6833 Body mass index (BMI) 33.0-33.9, adult: Secondary | ICD-10-CM

## 2021-10-19 DIAGNOSIS — Z23 Encounter for immunization: Secondary | ICD-10-CM | POA: Diagnosis not present

## 2021-10-19 DIAGNOSIS — E6609 Other obesity due to excess calories: Secondary | ICD-10-CM

## 2021-10-19 LAB — LIPID PANEL
Cholesterol: 249 mg/dL — ABNORMAL HIGH (ref 0–200)
HDL: 45.1 mg/dL (ref >39.00)
LDL Cholesterol: 170 mg/dL — ABNORMAL HIGH (ref 0–99)
NonHDL: 203.6
Total CHOL/HDL Ratio: 6
Triglycerides: 169 mg/dL — ABNORMAL HIGH (ref 0.0–149.0)
VLDL: 33.8 mg/dL (ref 0.0–40.0)

## 2021-10-19 LAB — CBC WITH DIFFERENTIAL/PLATELET
Basophils Absolute: 0 10*3/uL (ref 0.0–0.1)
Basophils Relative: 0.6 % (ref 0.0–3.0)
Eosinophils Absolute: 0.1 10*3/uL (ref 0.0–0.7)
Eosinophils Relative: 1.2 % (ref 0.0–5.0)
HCT: 41.9 % (ref 36.0–46.0)
Hemoglobin: 13.9 g/dL (ref 12.0–15.0)
Lymphocytes Relative: 25.8 % (ref 12.0–46.0)
Lymphs Abs: 1.8 10*3/uL (ref 0.7–4.0)
MCHC: 33.1 g/dL (ref 30.0–36.0)
MCV: 87.7 fl (ref 78.0–100.0)
Monocytes Absolute: 0.4 10*3/uL (ref 0.1–1.0)
Monocytes Relative: 5.6 % (ref 3.0–12.0)
Neutro Abs: 4.8 10*3/uL (ref 1.4–7.7)
Neutrophils Relative %: 66.8 % (ref 43.0–77.0)
Platelets: 306 10*3/uL (ref 150.0–400.0)
RBC: 4.78 Mil/uL (ref 3.87–5.11)
RDW: 13.9 % (ref 11.5–15.5)
WBC: 7.2 10*3/uL (ref 4.0–10.5)

## 2021-10-19 LAB — TSH: TSH: 1.7 u[IU]/mL (ref 0.35–5.50)

## 2021-10-19 LAB — COMPREHENSIVE METABOLIC PANEL
ALT: 17 U/L (ref 0–35)
AST: 14 U/L (ref 0–37)
Albumin: 4.2 g/dL (ref 3.5–5.2)
Alkaline Phosphatase: 118 U/L — ABNORMAL HIGH (ref 39–117)
BUN: 17 mg/dL (ref 6–23)
CO2: 30 mEq/L (ref 19–32)
Calcium: 9.5 mg/dL (ref 8.4–10.5)
Chloride: 103 mEq/L (ref 96–112)
Creatinine, Ser: 0.7 mg/dL (ref 0.40–1.20)
GFR: 94.44 mL/min (ref >60.00)
Glucose, Bld: 93 mg/dL (ref 70–99)
Potassium: 4.6 mEq/L (ref 3.5–5.1)
Sodium: 140 mEq/L (ref 135–145)
Total Bilirubin: 0.5 mg/dL (ref 0.2–1.2)
Total Protein: 7.3 g/dL (ref 6.0–8.3)

## 2021-10-19 LAB — HEMOGLOBIN A1C: Hgb A1c MFr Bld: 6.2 % (ref 4.6–6.5)

## 2021-10-19 MED ORDER — RIZATRIPTAN BENZOATE 10 MG PO TBDP: 10.0000 mg | ORAL_TABLET | ORAL | 11 refills | Status: DC | PRN

## 2021-10-19 MED ORDER — ONDANSETRON HCL 4 MG PO TABS: ORAL_TABLET | ORAL | 1 refills | Status: DC

## 2021-10-19 NOTE — Assessment & Plan Note (Addendum)
Chronic Currently working on weight loss Will start regular exercise Continue healthy diet  Check TSH

## 2021-10-19 NOTE — Addendum Note (Signed)
Addended by: Marcina Millard on: 10/19/2021 11:37 AM   Modules accepted: Orders

## 2021-10-19 NOTE — Assessment & Plan Note (Signed)
Chronic Controlled Continue Maxalt 10 mg daily as needed, Zofran 4 mg daily as needed

## 2021-10-19 NOTE — Assessment & Plan Note (Addendum)
Chronic Check a1c Low sugar / carb diet Will start regular exercise when able

## 2021-10-19 NOTE — Assessment & Plan Note (Signed)
Chronic Regular exercise and healthy diet encouraged Check lipid panel, CMP, TSH Currently lifestyle controlled

## 2021-10-20 ENCOUNTER — Encounter: Payer: Self-pay | Admitting: Internal Medicine

## 2021-10-20 DIAGNOSIS — E7849 Other hyperlipidemia: Secondary | ICD-10-CM

## 2021-11-01 ENCOUNTER — Encounter: Payer: Self-pay | Admitting: Internal Medicine

## 2021-11-01 NOTE — Progress Notes (Signed)
Outside notes received. Information abstracted. Notes sent to scan.  

## 2022-05-21 ENCOUNTER — Other Ambulatory Visit: Payer: Self-pay | Admitting: Internal Medicine

## 2022-12-17 LAB — HM MAMMOGRAPHY

## 2022-12-23 ENCOUNTER — Encounter: Payer: Self-pay | Admitting: Internal Medicine

## 2023-03-28 ENCOUNTER — Telehealth: Payer: 59 | Admitting: Emergency Medicine

## 2023-03-28 DIAGNOSIS — H1033 Unspecified acute conjunctivitis, bilateral: Secondary | ICD-10-CM

## 2023-03-28 MED ORDER — POLYMYXIN B-TRIMETHOPRIM 10000-0.1 UNIT/ML-% OP SOLN: 1.0000 [drp] | OPHTHALMIC | 0 refills | Status: AC

## 2023-03-28 NOTE — Patient Instructions (Signed)
  [name], thank you for joining Lamar Schlossman, PA-C for today's virtual visit.  While this provider is not your primary care provider (PCP), if your PCP is located in our provider database this encounter information will be shared with them immediately following your visit.   A [facility] MyChart account gives you access to today's visit and all your visits, tests, and labs performed at [facility]  click here if you don't have a [facility] MyChart account or go to [email or web address]  Consent: (Patient) [name] provided verbal consent for this virtual visit at the beginning of the encounter.  Current Medications:  Current Outpatient Medications:    trimethoprim -polymyxin b  (POLYTRIM ) ophthalmic solution, Place 1 drop into both eyes every 4 (four) hours for 5 days., Disp: 10 mL, Rfl: 0   ketotifen (ALAWAY) 0.025 % ophthalmic solution, Place 1 drop into both eyes 2 (two) times daily as needed (allergies)., Disp: , Rfl:    ondansetron  (ZOFRAN ) 4 MG tablet, TAKE ONE TABLET BY MOUTH EVERY 8 HOURS AS NEEDED FOR NAUSEA WITH MIGRAINES, Disp: 20 tablet, Rfl: 1   rizatriptan  (MAXALT -MLT) 10 MG disintegrating tablet, Take 1 tablet (10 mg total) by mouth as needed for migraine. May repeat in 2 hours if needed, Disp: 10 tablet, Rfl: 11   Medications ordered in this encounter:  Meds ordered this encounter  Medications   trimethoprim -polymyxin b  (POLYTRIM ) ophthalmic solution    Sig: Place 1 drop into both eyes every 4 (four) hours for 5 days.    Dispense:  10 mL    Refill:  0    Supervising Provider:   BLAISE ALEENE KIDD [[id number]]     *If you need refills on other medications prior to your next appointment, please contact your pharmacy*  Follow-Up: Call back or seek an in-person evaluation if the symptoms worsen or if the condition fails to improve as anticipated.  [facility] [phone]  Other Instructions    If you have been instructed  to have an in-person evaluation today at a local Urgent Care facility, please use the link below. It will take you to a list of all of our available [facility], including address, phone number and hours of operation. Please do not delay care.  [facility]  If you or a family member do not have a primary care provider, use the link below to schedule a visit and establish care. When you choose a [facility] primary care physician or advanced practice provider, you gain a long-term partner in health. Find a Primary Care Provider  Learn more about 's in-office and virtual care options:  - Get Care Now

## 2023-03-28 NOTE — Progress Notes (Signed)
 Virtual Visit Consent   [name], you are scheduled for a virtual visit with a [facility] provider today. Just as with appointments in the office, your consent must be obtained to participate. Your consent will be active for this visit and any virtual visit you may have with one of our providers in the next 365 days. If you have a MyChart account, a copy of this consent can be sent to you electronically.  As this is a virtual visit, video technology does not allow for your provider to perform a traditional examination. This may limit your provider's ability to fully assess your condition. If your provider identifies any concerns that need to be evaluated in person or the need to arrange testing (such as labs, EKG, etc.), we will make arrangements to do so. Although advances in technology are sophisticated, we cannot ensure that it will always work on either your end or our end. If the connection with a video visit is poor, the visit may have to be switched to a telephone visit. With either a video or telephone visit, we are not always able to ensure that we have a secure connection.  By engaging in this virtual visit, you consent to the provision of healthcare and authorize for your insurance to be billed (if applicable) for the services provided during this visit. Depending on your insurance coverage, you may receive a charge related to this service.  I need to obtain your verbal consent now. Are you willing to proceed with your visit today? [name] has provided verbal consent on 03/28/2023 for a virtual visit (video or telephone). Lamar Schlossman, PA-C  Date: 03/28/2023 2:06 PM  Virtual Visit via Video Note   I, Lamar Schlossman, connected with  [name]  ([id number], 1960/12/27) on 03/28/23 at  2:00 PM EST by a video-enabled telemedicine application and verified that I am speaking with the correct person using two identifiers.  Location: Patient: Virtual Visit Location Patient:  Home Provider: Virtual Visit Location Provider: Home Office   I discussed the limitations of evaluation and management by telemedicine and the availability of in person appointments. The patient expressed understanding and agreed to proceed.    History of Present Illness: [name] is a 63 y.o. who identifies as a female who was assigned female at birth, and is being seen today for eye irritation.  States that she noticed symptoms that started last night.  States that the eye feels irritated.  States that left eye is itchy and irritated.  Denies any increased tearing. States that she does wear contacts, but hasn't worn them in 2 weeks.  Denies pain.  Denies flashers, floaters, or loss of vision.  HPI: HPI  Problems:  Patient Active Problem List   Diagnosis Date Noted   Cervical disc herniation 12/02/2018   Elevated alanine aminotransferase (ALT) level 11/05/2018   Obesity 04/30/2018   Allergic rhinitis 03/26/2016   Family history of colon cancer 02/24/2016   Prediabetes 02/18/2016   Hyperlipidemia 02/18/2016   Stress incontinence 02/14/2016   Migraines 02/18/1993    Allergies: No Known Allergies Medications:  Current Outpatient Medications:    trimethoprim -polymyxin b  (POLYTRIM ) ophthalmic solution, Place 1 drop into both eyes every 4 (four) hours for 5 days., Disp: 10 mL, Rfl: 0   ketotifen (ALAWAY) 0.025 % ophthalmic solution, Place 1 drop into both eyes 2 (two) times daily as needed (allergies)., Disp: , Rfl:    ondansetron  (ZOFRAN ) 4 MG tablet, TAKE ONE TABLET BY  MOUTH EVERY 8 HOURS AS NEEDED FOR NAUSEA WITH MIGRAINES, Disp: 20 tablet, Rfl: 1   rizatriptan  (MAXALT -MLT) 10 MG disintegrating tablet, Take 1 tablet (10 mg total) by mouth as needed for migraine. May repeat in 2 hours if needed, Disp: 10 tablet, Rfl: 11  Observations/Objective: Patient is well-developed, well-nourished in no acute distress.  Resting comfortably at home.  Head is normocephalic, atraumatic.  No  labored breathing.  Speech is clear and coherent with logical content.  Patient is alert and oriented at baseline.   Poor video quality, was switched to phone call.    Assessment and Plan: 1. Acute conjunctivitis of both eyes, unspecified acute conjunctivitis type (Primary)   Meds ordered this encounter  Medications   trimethoprim -polymyxin b  (POLYTRIM ) ophthalmic solution    Sig: Place 1 drop into both eyes every 4 (four) hours for 5 days.    Dispense:  10 mL    Refill:  0    Supervising Provider:   BLAISE ALEENE KIDD [[id number]]   Patient was able to send me a picture of the eye that looks consistent with a conjunctivitis.  The video quality wasn't good enough to obtain a reliable exam.  Will treat with polytrim .  She does wear contacts, but hasn't worn them in at least 2 weeks and states she normally wears glasses.  Follow Up Instructions: I discussed the assessment and treatment plan with the patient. The patient was provided an opportunity to ask questions and all were answered. The patient agreed with the plan and demonstrated an understanding of the instructions.  A copy of instructions were sent to the patient via MyChart unless otherwise noted below.     The patient was advised to call back or seek an in-person evaluation if the symptoms worsen or if the condition fails to improve as anticipated.    Lamar Schlossman, PA-C

## 2023-06-25 ENCOUNTER — Encounter: Payer: Self-pay | Admitting: Internal Medicine

## 2023-06-25 ENCOUNTER — Other Ambulatory Visit: Payer: Self-pay

## 2023-06-26 ENCOUNTER — Other Ambulatory Visit: Payer: Self-pay

## 2023-06-26 DIAGNOSIS — G43109 Migraine with aura, not intractable, without status migrainosus: Secondary | ICD-10-CM

## 2023-06-26 MED ORDER — ONDANSETRON HCL 4 MG PO TABS: ORAL_TABLET | ORAL | 5 refills | Status: AC

## 2023-06-26 MED ORDER — RIZATRIPTAN BENZOATE 10 MG PO TBDP: 10.0000 mg | ORAL_TABLET | ORAL | 11 refills | Status: AC | PRN
